# Patient Record
Sex: Male | Born: 1965
Health system: Southern US, Community
[De-identification: ages and names within clinical notes are randomized; demographics above are authoritative.]

## PROBLEM LIST (undated history)

## (undated) DIAGNOSIS — N200 Calculus of kidney: Secondary | ICD-10-CM

## (undated) DIAGNOSIS — E785 Hyperlipidemia, unspecified: Secondary | ICD-10-CM

## (undated) HISTORY — DX: Hyperlipidemia, unspecified: E78.5

## (undated) HISTORY — DX: Calculus of kidney: N20.0

---

## 2011-10-24 ENCOUNTER — Ambulatory Visit (INDEPENDENT_AMBULATORY_CARE_PROVIDER_SITE_OTHER): Payer: BC Managed Care – PPO | Admitting: Family Medicine

## 2011-10-24 DIAGNOSIS — F411 Generalized anxiety disorder: Secondary | ICD-10-CM

## 2011-10-24 DIAGNOSIS — R0602 Shortness of breath: Secondary | ICD-10-CM

## 2011-11-11 ENCOUNTER — Telehealth: Payer: Self-pay

## 2011-11-11 NOTE — Telephone Encounter (Signed)
.  [facility]    PT REQUESTING LAB RESULTS  BEST PHONE  [phone]

## 2011-11-12 NOTE — Telephone Encounter (Signed)
Chart not filed back, and don't see in [facility]. Can you please check on lab results?

## 2013-02-02 ENCOUNTER — Encounter: Payer: Self-pay | Admitting: Family Medicine

## 2013-02-02 ENCOUNTER — Ambulatory Visit (INDEPENDENT_AMBULATORY_CARE_PROVIDER_SITE_OTHER): Payer: BC Managed Care – PPO | Admitting: Family Medicine

## 2013-02-02 VITALS — BP 125/70 | HR 81 | Temp 98.0°F | Resp 18 | Wt 208.0 lb

## 2013-02-02 DIAGNOSIS — Z0000 Encounter for general adult medical examination without abnormal findings: Secondary | ICD-10-CM

## 2013-02-02 DIAGNOSIS — M5136 Other intervertebral disc degeneration, lumbar region: Secondary | ICD-10-CM

## 2013-02-02 DIAGNOSIS — M51379 Other intervertebral disc degeneration, lumbosacral region without mention of lumbar back pain or lower extremity pain: Secondary | ICD-10-CM

## 2013-02-02 DIAGNOSIS — G4720 Circadian rhythm sleep disorder, unspecified type: Secondary | ICD-10-CM | POA: Insufficient documentation

## 2013-02-02 DIAGNOSIS — M5137 Other intervertebral disc degeneration, lumbosacral region: Secondary | ICD-10-CM

## 2013-02-02 DIAGNOSIS — R5383 Other fatigue: Secondary | ICD-10-CM

## 2013-02-02 DIAGNOSIS — R5381 Other malaise: Secondary | ICD-10-CM

## 2013-02-02 DIAGNOSIS — E7800 Pure hypercholesterolemia, unspecified: Secondary | ICD-10-CM

## 2013-02-02 DIAGNOSIS — L609 Nail disorder, unspecified: Secondary | ICD-10-CM

## 2013-02-02 DIAGNOSIS — E785 Hyperlipidemia, unspecified: Secondary | ICD-10-CM | POA: Insufficient documentation

## 2013-02-02 DIAGNOSIS — G479 Sleep disorder, unspecified: Secondary | ICD-10-CM

## 2013-02-02 DIAGNOSIS — L608 Other nail disorders: Secondary | ICD-10-CM

## 2013-02-02 LAB — POCT URINALYSIS DIPSTICK
Bilirubin, UA: NEGATIVE
Glucose, UA: NEGATIVE
Ketones, UA: NEGATIVE
Leukocytes, UA: NEGATIVE
Nitrite, UA: NEGATIVE

## 2013-02-02 NOTE — Patient Instructions (Addendum)
Keeping you healthy  Get these tests  Blood pressure- Have your blood pressure checked once a year by your healthcare provider.  Normal blood pressure is 120/80.  Weight- Have your body mass index (BMI) calculated to screen for obesity.  BMI is a measure of body fat based on height and weight. You can also calculate your own BMI at https://www.west-esparza.com/.  Cholesterol- Have your cholesterol checked regularly starting at age 46, sooner may be necessary if you have diabetes, high blood pressure, if a family member developed heart diseases at an early age or if you smoke.   Chlamydia, HIV, and other sexual transmitted disease- Get screened each year until the age of 70 then within three months of each new sexual partner.  Diabetes- Have your blood sugar checked regularly if you have high blood pressure, high cholesterol, a family history of diabetes or if you are overweight.  Get these vaccines  Flu shot- Every fall.  Tetanus shot- Every 10 years. The CDC recommends a one-time Tdap vaccine to protect against Tetanus and pertussis (whooping cough).  Menactra- Single dose; prevents meningitis.  Take these steps  Don't smoke- If you do smoke, ask your healthcare provider about quitting. For tips on how to quit, go to www.smokefree.gov or call 1-800-QUIT-NOW.  Be physically active- Exercise 5 days a week for at least 30 minutes.  If you are not already physically active start slow and gradually work up to 30 minutes of moderate physical activity.  Examples of moderate activity include walking briskly, mowing the yard, dancing, swimming bicycling, etc.  Eat a healthy diet- Eat a variety of healthy foods such as fruits, vegetables, low fat milk, low fat cheese, yogurt, lean meats, poultry, fish, beans, tofu, etc.  For more information on healthy eating, go to www.thenutritionsource.org  Drink alcohol in moderation- Limit alcohol intake two drinks or less a day.  Never drink and  drive.  Dentist- Brush and floss teeth twice daily; visit your dentis twice a year.  Depression-Your emotional health is as important as your physical health.  If you're feeling down, losing interest in things you normally enjoy please talk with your healthcare provider.  Gun Safety- If you keep a gun in your home, keep it unloaded and with the safety lock on.  Bullets should be stored separately.  Helmet use- Always wear a helmet when riding a motorcycle, bicycle, rollerblading or skateboarding.  Safe sex- If you may be exposed to a sexually transmitted infection, use a condom  Seat belts- Seat bels can save your life; always wear one.  Smoke/Carbon Monoxide detectors- These detectors need to be installed on the appropriate level of your home.  Replace batteries at least once a year.  Skin Cancer- When out in the sun, cover up and use sunscreen SPF 15 or higher.  Violence- If anyone is threatening or hurting you, please tell your healthcare provider.  A good multivitamin is important if you do not have balanced nutrition with regular meals and healthy snacks. One-A-Day for Men or Ash[employer] Made or Centrum for Men are good brands. I have ordered a referral to a chiropractor to evaluate and treat your lumbar disc disease as well as a podiatrist for the nail problem.  You will be working on improving sleep pattern by establishing a rotation for call with your work place.   Insomnia Insomnia is frequent trouble falling and/or staying asleep. Insomnia can be a long term problem or a short term problem. Both are common. Insomnia can be  a short term problem when the wakefulness is related to a certain stress or worry. Long term insomnia is often related to ongoing stress during waking hours and/or poor sleeping habits. Overtime, sleep deprivation itself can make the problem worse. Every little thing feels more severe because you are overtired and your ability to cope is decreased. CAUSES    Stress, anxiety, and depression.  Poor sleeping habits.  Distractions such as TV in the bedroom.  Naps close to bedtime.  Engaging in emotionally charged conversations before bed.  Technical reading before sleep.  Alcohol and other sedatives. They may make the problem worse. They can hurt normal sleep patterns and normal dream activity.  Stimulants such as caffeine for several hours prior to bedtime.  Pain syndromes and shortness of breath can cause insomnia.  Exercise late at night.  Changing time zones may cause sleeping problems (jet lag). It is sometimes helpful to have someone observe your sleeping patterns. They should look for periods of not breathing during the night (sleep apnea). They should also look to see how long those periods last. If you live alone or observers are uncertain, you can also be observed at a sleep clinic where your sleep patterns will be professionally monitored. Sleep apnea requires a checkup and treatment. Give your caregivers your medical history. Give your caregivers observations your family has made about your sleep.  SYMPTOMS   Not feeling rested in the morning.  Anxiety and restlessness at bedtime.  Difficulty falling and staying asleep. TREATMENT   Your caregiver may prescribe treatment for an underlying medical disorders. Your caregiver can give advice or help if you are using alcohol or other drugs for self-medication. Treatment of underlying problems will usually eliminate insomnia problems.  Medications can be prescribed for short time use. They are generally not recommended for lengthy use.  Over-the-counter sleep medicines are not recommended for lengthy use. They can be habit forming.  You can promote easier sleeping by making lifestyle changes such as:  Using relaxation techniques that help with breathing and reduce muscle tension.  Exercising earlier in the day.  Changing your diet and the time of your last meal. No night  time snacks.  Establish a regular time to go to bed.  Counseling can help with stressful problems and worry.  Soothing music and white noise may be helpful if there are background noises you cannot remove.  Stop tedious detailed work at least one hour before bedtime. HOME CARE INSTRUCTIONS   Keep a diary. Inform your caregiver about your progress. This includes any medication side effects. See your caregiver regularly. Take note of:  Times when you are asleep.  Times when you are awake during the night.  The quality of your sleep.  How you feel the next day. This information will help your caregiver care for you.  Get out of bed if you are still awake after 15 minutes. Read or do some quiet activity. Keep the lights down. Wait until you feel sleepy and go back to bed.  Keep regular sleeping and waking hours. Avoid naps.  Exercise regularly.  Avoid distractions at bedtime. Distractions include watching television or engaging in any intense or detailed activity like attempting to balance the household checkbook.  Develop a bedtime ritual. Keep a familiar routine of bathing, brushing your teeth, climbing into bed at the same time each night, listening to soothing music. Routines increase the success of falling to sleep faster.  Use relaxation techniques. This can be using breathing and  muscle tension release routines. It can also include visualizing peaceful scenes. You can also help control troubling or intruding thoughts by keeping your mind occupied with boring or repetitive thoughts like the old concept of counting sheep. You can make it more creative like imagining planting one beautiful flower after another in your backyard garden.  During your day, work to eliminate stress. When this is not possible use some of the previous suggestions to help reduce the anxiety that accompanies stressful situations. MAKE SURE YOU:   Understand these instructions.  Will watch your  condition.  Will get help right away if you are not doing well or get worse. Document Released: 09/19/2000 Document Revised: 12/15/2011 Document Reviewed: 10/20/2007 [facility] Patient Information 2013 [place], [place].

## 2013-02-02 NOTE — Progress Notes (Signed)
Subjective:    Patient ID: Steven Raymond, male    DOB: Nov 17, 1965, 47 y.o.   MRN: [id number]  HPI This 47 y.o. Cauc male is here for CPE; hehas no specific chronic medical problems but is  concerned about chronic fatigue. Nutrition is fair; pt does try to avoid "junk food" but diet is not  balanced. "Low T" is a concern. His work day is long and requires physical fitness. 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 These workouts often leave him  feeling battered and bruised and have aggravated his neck discomfort, joint and back pain.   He has a seep disturbance; this is related to his on-call status w/ his job. He gets calls in the   middle of the night that requires him to get up and leave his home to handles different issues.  Melatonin sedated him for a few hours then he was wide awake ("kinda wired"). A new mattress  has been purchased within the last year.   PMHx, Soc Hx and Fam Hx reviewed.     Review of Systems  Constitutional: Positive for fatigue. Negative for fever, diaphoresis, activity change, appetite change and unexpected weight change.  HENT: Positive for neck stiffness. Negative for neck pain.   Eyes:       Wears corrective lenses.  Respiratory: Negative.   Cardiovascular: Negative.   Gastrointestinal: Negative.   Endocrine: Negative.   Genitourinary: Negative.   Musculoskeletal: Positive for back pain and arthralgias. Negative for myalgias, joint swelling and gait problem.  Skin: Negative.   Allergic/Immunologic: Negative.   Neurological: Negative.   Hematological: Negative.   Psychiatric/Behavioral: Positive for sleep disturbance.       Objective:   Physical Exam  Nursing note and vitals reviewed. Constitutional: He is oriented to person, place, and time. Vital signs are normal. He appears well-developed and well-nourished. No distress.  HENT:  Head: Normocephalic and atraumatic.  Right Ear: Hearing, tympanic membrane,  external ear and ear canal normal.  Left Ear: Hearing, tympanic membrane, external ear and ear canal normal.  Nose: Nose normal. No nasal deformity or septal deviation.  Mouth/Throat: Uvula is midline, oropharynx is clear and moist and mucous membranes are normal. No oral lesions. Normal dentition. No dental caries.  Eyes: Conjunctivae, EOM and lids are normal. Pupils are equal, round, and reactive to light. No scleral icterus.  Fundoscopic exam:      The right eye shows no papilledema. The right eye shows red reflex.       The left eye shows no papilledema. The left eye shows red reflex.  Neck: Normal range of motion. Neck supple. No thyromegaly present.  Cardiovascular: Normal rate, regular rhythm, normal heart sounds and intact distal pulses.  Exam reveals no gallop and no friction rub.   No murmur heard. Pulmonary/Chest: Effort normal and breath sounds normal. No respiratory distress. He has no wheezes. He exhibits no tenderness.  Abdominal: Soft. Normal appearance and bowel sounds are normal. He exhibits no distension, no pulsatile midline mass and no mass. There is no hepatosplenomegaly. There is no tenderness. There is no guarding and no CVA tenderness. No hernia.  Genitourinary:  Deferred.  Musculoskeletal: Normal range of motion. He exhibits no edema and no tenderness.       Lumbar back: He exhibits tenderness and bony tenderness. He exhibits no swelling, no deformity, no pain and no spasm.  Lymphadenopathy:    He has no cervical adenopathy.  Neurological: He is alert and oriented to  person, place, and time. He has normal reflexes. No cranial nerve deficit. He exhibits normal muscle tone. Coordination normal.  Skin: Skin is warm and dry. No rash noted. No erythema. No pallor.  Feet- great toenail is discolored and deformed.  Psychiatric: He has a normal mood and affect. Judgment and thought content normal. His speech is tangential. His speech is not rapid and/or pressured and not  delayed. He is hyperactive. He is not agitated, not aggressive and not withdrawn. Cognition and memory are normal. He is communicative. He is attentive.    Results for orders placed in visit on 02/02/13  POCT URINALYSIS DIPSTICK      Result Value Range   Color, UA yellow     Clarity, UA slightly cloudy     Glucose, UA neg     Bilirubin, UA neg     Ketones, UA neg     Spec Grav, UA 1.020     Blood, UA neg     pH, UA 8.5     Protein, UA neg     Urobilinogen, UA 0.2     Nitrite, UA neg     Leukocytes, UA Negative          Assessment & Plan:  Routine general medical examination at a health care facility - Plan: POCT urinalysis dipstick  Other malaise and fatigue - Plan: Comprehensive metabolic panel, Vitamin D, 25-hydroxy, CBC with Differential, Testosterone, free, total  Sleep pattern disturbance- pt advised to be pro-active re: limiting work-related on-call schedule and night calls. Institute regular bedtime routine.  Nail deformity - Plan: Ambulatory referral to Podiatry  DDD (degenerative disc disease), lumbar - advised that pt discontinued participation in MMA fitness. Discuss some other aerobic exercise or one-on-one training with trainer.  Plan: Ambulatory referral to Chiropractic  Pure hypercholesterolemia - elevated values (TChol, LDL and TGs last year)      Plan: Lipid panel

## 2013-02-03 ENCOUNTER — Telehealth: Payer: Self-pay | Admitting: Radiology

## 2013-02-03 DIAGNOSIS — R5381 Other malaise: Secondary | ICD-10-CM

## 2013-02-03 LAB — CBC WITH DIFFERENTIAL/PLATELET

## 2013-02-03 LAB — COMPREHENSIVE METABOLIC PANEL
ALT: 18 U/L (ref 0–53)
Albumin: 4.5 g/dL (ref 3.5–5.2)
CO2: 25 mEq/L (ref 19–32)
Glucose, Bld: 93 mg/dL (ref 70–99)
Potassium: 4.6 mEq/L (ref 3.5–5.3)
Sodium: 137 mEq/L (ref 135–145)
Total Bilirubin: 0.4 mg/dL (ref 0.3–1.2)
Total Protein: 7.3 g/dL (ref 6.0–8.3)

## 2013-02-03 LAB — LIPID PANEL
Cholesterol: 197 mg/dL (ref 0–200)
LDL Cholesterol: 126 mg/dL — ABNORMAL HIGH (ref 0–99)
Triglycerides: 97 mg/dL (ref <150)
VLDL: 19 mg/dL (ref 0–40)

## 2013-02-03 LAB — TESTOSTERONE, FREE, TOTAL, SHBG
Sex Hormone Binding: 46 nmol/L (ref 13–71)
Testosterone, Free: 70.9 pg/mL (ref 47.0–244.0)
Testosterone: 428 ng/dL (ref 300–890)

## 2013-02-03 NOTE — Telephone Encounter (Signed)
Phone call from [facility], no purple top received CBC can not be done. Cancelled at lab to you [facility]

## 2013-02-03 NOTE — Telephone Encounter (Signed)
Received call from [facility], they did not receive the lavender top to perform CBC. Would you like for [place] to call patient and have them come back in at no charge or redraw at next office visit? Discussed with Dr Audria Nine and she stated if patient is fine with coming in to have re-draw at no charge that would be okay. I called patient and informed of the issue-he is agreeable to come in at no charge and have redraw of blood work. I apologized again for the inconvenience. I will place the order for CBC with Diff.

## 2013-02-03 NOTE — Telephone Encounter (Signed)
Received call from [facility], they did not receive the lavender top to perform CBC. Would you like for [place] to call patient and have them come back in at no charge or redraw at next office visit?

## 2013-02-05 ENCOUNTER — Encounter: Payer: Self-pay | Admitting: Family Medicine

## 2013-08-11 ENCOUNTER — Other Ambulatory Visit: Payer: Self-pay

## 2013-09-23 DIAGNOSIS — B351 Tinea unguium: Secondary | ICD-10-CM

## 2013-11-07 ENCOUNTER — Telehealth: Payer: Self-pay | Admitting: Radiology

## 2013-11-07 ENCOUNTER — Ambulatory Visit: Payer: BC Managed Care – PPO

## 2013-11-07 ENCOUNTER — Ambulatory Visit (INDEPENDENT_AMBULATORY_CARE_PROVIDER_SITE_OTHER): Payer: BC Managed Care – PPO | Admitting: Emergency Medicine

## 2013-11-07 VITALS — BP 118/78 | HR 74 | Temp 97.6°F | Resp 16 | Ht 74.0 in | Wt 201.6 lb

## 2013-11-07 DIAGNOSIS — R0602 Shortness of breath: Secondary | ICD-10-CM

## 2013-11-07 DIAGNOSIS — M542 Cervicalgia: Secondary | ICD-10-CM

## 2013-11-07 DIAGNOSIS — IMO0001 Reserved for inherently not codable concepts without codable children: Secondary | ICD-10-CM

## 2013-11-07 DIAGNOSIS — R638 Other symptoms and signs concerning food and fluid intake: Secondary | ICD-10-CM

## 2013-11-07 DIAGNOSIS — R9389 Abnormal findings on diagnostic imaging of other specified body structures: Secondary | ICD-10-CM

## 2013-11-07 LAB — COMPREHENSIVE METABOLIC PANEL
ALK PHOS: 65 U/L (ref 39–117)
ALT: 18 U/L (ref 0–53)
AST: 17 U/L (ref 0–37)
Albumin: 4.5 g/dL (ref 3.5–5.2)
BILIRUBIN TOTAL: 0.6 mg/dL (ref 0.2–1.2)
BUN: 20 mg/dL (ref 6–23)
CALCIUM: 9.4 mg/dL (ref 8.4–10.5)
CHLORIDE: 102 meq/L (ref 96–112)
CO2: 29 mEq/L (ref 19–32)
CREATININE: 0.99 mg/dL (ref 0.50–1.35)
Glucose, Bld: 97 mg/dL (ref 70–99)
Potassium: 4.4 mEq/L (ref 3.5–5.3)
Sodium: 137 mEq/L (ref 135–145)
Total Protein: 7.1 g/dL (ref 6.0–8.3)

## 2013-11-07 LAB — POCT CBC
GRANULOCYTE PERCENT: 69.5 % (ref 37–80)
HCT, POC: 51.7 % (ref 43.5–53.7)
Hemoglobin: 16.4 g/dL (ref 14.1–18.1)
Lymph, poc: 2 (ref 0.6–3.4)
MCH, POC: 30.9 pg (ref 27–31.2)
MCHC: 31.7 g/dL — AB (ref 31.8–35.4)
MCV: 97.3 fL — AB (ref 80–97)
MID (cbc): 0.6 (ref 0–0.9)
MPV: 9.3 fL (ref 0–99.8)
PLATELET COUNT, POC: 257 10*3/uL (ref 142–424)
POC GRANULOCYTE: 5.9 (ref 2–6.9)
POC LYMPH PERCENT: 24 %L (ref 10–50)
POC MID %: 6.5 %M (ref 0–12)
RBC: 5.31 M/uL (ref 4.69–6.13)
RDW, POC: 13.8 %
WBC: 8.5 10*3/uL (ref 4.6–10.2)

## 2013-11-07 LAB — POCT URINALYSIS DIPSTICK
Bilirubin, UA: NEGATIVE
Blood, UA: NEGATIVE
Glucose, UA: NEGATIVE
Ketones, UA: NEGATIVE
LEUKOCYTES UA: NEGATIVE
NITRITE UA: NEGATIVE
PH UA: 8.5
Protein, UA: NEGATIVE
Spec Grav, UA: 1.015
UROBILINOGEN UA: 0.2

## 2013-11-07 LAB — GLUCOSE, POCT (MANUAL RESULT ENTRY): POC GLUCOSE: 96 mg/dL (ref 70–99)

## 2013-11-07 LAB — D-DIMER, QUANTITATIVE: D-Dimer, Quant: 0.22 ug/mL-FEU (ref 0.00–0.48)

## 2013-11-07 MED ORDER — LORAZEPAM 1 MG PO TABS: ORAL_TABLET | ORAL | Status: DC

## 2013-11-07 NOTE — Progress Notes (Signed)
   Subjective:    Patient ID: Steven Raymond, male    DOB: 1966-07-15, 48 y.o.   MRN: [id number]  HPI Pt here c/o of sob of breath feels like there is fluid in his lungs. Worse last week and today. Feels like pneumonia. Denies cough, sore throat, chest congestion or asthma. Also tightness in the chest. Patient states he has a very stressful job. He has a very erratic schedule. He is up late into the night. He does not work any particular shifts    Review of Systems     Objective:   Physical Exam patient is alert and cooperative but does seem very anxious. Pupils are equal reactive to light. His neck is supple. Chest was clear to auscultation and percussion. Heart was regular rate without murmurs or gallops. Abdomen was soft liver and spleen not enlarged and no areas of tenderness. Extremities are without edema there is no calf tenderness   Ekg normal sinus rhythm Results for orders placed in visit on 11/07/13  POCT CBC      Result Value Range   WBC 8.5  4.6 - 10.2 K/uL   Lymph, poc 2.0  0.6 - 3.4   POC LYMPH PERCENT 24.0  10 - 50 %L   MID (cbc) 0.6  0 - 0.9   POC MID % 6.5  0 - 12 %M   POC Granulocyte 5.9  2 - 6.9   Granulocyte percent 69.5  37 - 80 %G   RBC 5.31  4.69 - 6.13 M/uL   Hemoglobin 16.4  14.1 - 18.1 g/dL   HCT, POC 51.7  43.5 - 53.7 %   MCV 97.3 (*) 80 - 97 fL   MCH, POC 30.9  27 - 31.2 pg   MCHC 31.7 (*) 31.8 - 35.4 g/dL   RDW, POC 13.8     Platelet Count, POC 257  142 - 424 K/uL   MPV 9.3  0 - 99.8 fL  GLUCOSE, POCT (MANUAL RESULT ENTRY)      Result Value Range   POC Glucose 96  70 - 99 mg/dl  POCT URINALYSIS DIPSTICK      Result Value Range   Color, UA yellow     Clarity, UA cloudy     Glucose, UA neg     Bilirubin, UA neg     Ketones, UA neg     Spec Grav, UA 1.015     Blood, UA neg     pH, UA 8.5     Protein, UA neg     Urobilinogen, UA 0.2     Nitrite, UA neg     Leukocytes, UA Negative        [facility] reading (PRIMARY) by  Dr. Everlene Farrier increased markings  both bases no consolidative pneumonia. C-spine films showed degenerative changes C6-C7 Zung anxiety score was 50 which puts him in the minimal to moderate anxiety level.  Assessment & Plan:  Patient appears to have anxiety. His urine drug screen was negative for any type of stimulant. Will try Ativan and see if this helps. A d-dimer was checked because of his travel history. Referral made to pulmonary because of the abnormal chest x-ray reading.

## 2013-11-07 NOTE — Telephone Encounter (Signed)
received STAT lab result- d dimer: 0.22 Informed Dr Danielle Rankin Normal result  Called patient and informed patient normal results; continue with medication plan. Patient understands

## 2013-11-08 LAB — T4, FREE: Free T4: 1.18 ng/dL (ref 0.80–1.80)

## 2013-11-08 LAB — TSH: TSH: 0.783 u[IU]/mL (ref 0.350–4.500)

## 2013-11-11 ENCOUNTER — Institutional Professional Consult (permissible substitution): Payer: Self-pay | Admitting: Pulmonary Disease

## 2013-12-06 ENCOUNTER — Institutional Professional Consult (permissible substitution): Payer: Self-pay | Admitting: Pulmonary Disease

## 2014-01-20 ENCOUNTER — Ambulatory Visit (INDEPENDENT_AMBULATORY_CARE_PROVIDER_SITE_OTHER): Payer: BC Managed Care – PPO | Admitting: Family Medicine

## 2014-01-20 VITALS — BP 122/74 | HR 70 | Temp 98.2°F | Resp 16 | Ht 74.0 in | Wt 207.7 lb

## 2014-01-20 DIAGNOSIS — R109 Unspecified abdominal pain: Secondary | ICD-10-CM

## 2014-01-20 DIAGNOSIS — F411 Generalized anxiety disorder: Secondary | ICD-10-CM

## 2014-01-20 LAB — POCT URINALYSIS DIPSTICK
Bilirubin, UA: NEGATIVE
Glucose, UA: NEGATIVE
KETONES UA: NEGATIVE
Leukocytes, UA: NEGATIVE
Nitrite, UA: NEGATIVE
Protein, UA: NEGATIVE
RBC UA: NEGATIVE
Spec Grav, UA: 1.025
UROBILINOGEN UA: 0.2
pH, UA: 7

## 2014-01-20 LAB — COMPREHENSIVE METABOLIC PANEL
ALK PHOS: 62 U/L (ref 39–117)
ALT: 24 U/L (ref 0–53)
AST: 19 U/L (ref 0–37)
Albumin: 4.4 g/dL (ref 3.5–5.2)
BILIRUBIN TOTAL: 0.6 mg/dL (ref 0.2–1.2)
BUN: 17 mg/dL (ref 6–23)
CO2: 25 mEq/L (ref 19–32)
CREATININE: 1.02 mg/dL (ref 0.50–1.35)
Calcium: 9.3 mg/dL (ref 8.4–10.5)
Chloride: 100 mEq/L (ref 96–112)
Glucose, Bld: 97 mg/dL (ref 70–99)
Potassium: 4.2 mEq/L (ref 3.5–5.3)
Sodium: 133 mEq/L — ABNORMAL LOW (ref 135–145)
Total Protein: 7.1 g/dL (ref 6.0–8.3)

## 2014-01-20 LAB — POCT CBC
Granulocyte percent: 51.9 %G (ref 37–80)
HCT, POC: 49 % (ref 43.5–53.7)
HEMOGLOBIN: 16 g/dL (ref 14.1–18.1)
LYMPH, POC: 2.3 (ref 0.6–3.4)
MCH, POC: 31.1 pg (ref 27–31.2)
MCHC: 32.7 g/dL (ref 31.8–35.4)
MCV: 95.3 fL (ref 80–97)
MID (CBC): 0.5 (ref 0–0.9)
MPV: 9.2 fL (ref 0–99.8)
POC GRANULOCYTE: 3 (ref 2–6.9)
POC LYMPH PERCENT: 39.6 %L (ref 10–50)
POC MID %: 8.5 % (ref 0–12)
Platelet Count, POC: 276 10*3/uL (ref 142–424)
RBC: 5.14 M/uL (ref 4.69–6.13)
RDW, POC: 12.9 %
WBC: 5.8 10*3/uL (ref 4.6–10.2)

## 2014-01-20 LAB — POCT UA - MICROSCOPIC ONLY
Amorphous: POSITIVE
Bacteria, U Microscopic: NEGATIVE
CASTS, UR, LPF, POC: NEGATIVE
Crystals, Ur, HPF, POC: NEGATIVE
EPITHELIAL CELLS, URINE PER MICROSCOPY: NEGATIVE
MUCUS UA: NEGATIVE
RBC, urine, microscopic: NEGATIVE
WBC, UR, HPF, POC: NEGATIVE
YEAST UA: NEGATIVE

## 2014-01-20 MED ORDER — LORAZEPAM 1 MG PO TABS: ORAL_TABLET | ORAL | Status: DC

## 2014-01-20 MED ORDER — METHOCARBAMOL 500 MG PO TABS: 500.0000 mg | ORAL_TABLET | Freq: Four times a day (QID) | ORAL | Status: DC

## 2014-01-20 MED ORDER — TAMSULOSIN HCL 0.4 MG PO CAPS: 0.4000 mg | ORAL_CAPSULE | Freq: Every day | ORAL | Status: DC

## 2014-01-20 MED ORDER — HYDROCODONE-ACETAMINOPHEN 5-325 MG PO TABS: 1.0000 | ORAL_TABLET | Freq: Three times a day (TID) | ORAL | Status: DC | PRN

## 2014-01-20 NOTE — Patient Instructions (Signed)
I will get in touch with you with the rest of your labs. Continue to drink plenty of water and strain your urine.  Take 1 flomax pill a day for the next week or two.   Try the robaxin as needed for back pain- this is a muscle relaxer and could make you feel sleepy.  If you have severe pain please seek care, but you can also use the vicodin in this case.    Remember your ativan, robaxin and vicodin can all make you feel drowsy; avoid using these medications together.    If you have severe back or abdominal pain, fever, vomiting, or severe testicular pain seek care right away.

## 2014-01-20 NOTE — Progress Notes (Signed)
[facility] [address] [place] [address] [phone]  Date:  01/20/2014   Name:  Steven Raymond   DOB:  1966/06/18   MRN:  [id number]  PCP:  No primary provider on file.    Chief Complaint: Flank Pain and Testicle Pain   History of Present Illness:  Steven Raymond is a 48 y.o. very pleasant male patient who presents with the following:  History of kidney stones- he has had a couple of stones in the past and has passed them on his own.  Never needed any sort of procedure.  About 3 weeks ago he awoke with some pain in his right flank.  It seemed "pretty sharp."  He was not too surprised as he is quite active and has some aches and pains sometimes.  He has noted the discomfort off and on in both sides of his back over the last few weeks.  He was not sure if it was muscular or could be a kidney stone.   Last night/ this morning he noted a mild discomfort and pressure in both his testicles.  This can wax and wane, and can get pretty uncomfortable sometimes.   No hematuria that he has noticed at home.  No penile discharge He has not noted any dysuria. However its kind of hard to saw now that his testicles are tender as well.    He is generally healthy  He does have a history of back problems, has had numbness in his right anterior thigh for 20 years- this is not new.  He does not have any groin or genital numbness, no bowel or bladder dysfunction.    He was given some ativan a few month ago which he takes on occasion for anxiety.  This is helpful for him.  He has 1 or 2 left but would like a RF is possible   Patient Active Problem List   Diagnosis Date Noted  . Sleep pattern disturbance 02/02/2013  . DDD (degenerative disc disease), lumbar 02/02/2013  . Other and unspecified hyperlipidemia 02/02/2013    Past Medical History  Diagnosis Date  . Kidney stones     History reviewed. No pertinent past surgical history.  History  Substance Use Topics  .  Smoking status: Current Some Day Smoker  . Smokeless tobacco: Not on file  . Alcohol Use: Yes    History reviewed. No pertinent family history.  No Known Allergies  Medication list has been reviewed and updated.  Current Outpatient Prescriptions on File Prior to Visit  Medication Sig Dispense Refill  . LORazepam (ATIVAN) 1 MG tablet Take one half to one tablet twice a day as needed for anxiety  30 tablet  0   No current facility-administered medications on file prior to visit.    Review of Systems:  As per HPI- otherwise negative.   Physical Examination: Filed Vitals:   01/20/14 1259  BP: 122/74  Pulse: 70  Temp: 98.2 F (36.8 C)  Resp: 16   Filed Vitals:   01/20/14 1259  Height: 6\' 2"  (1.88 m)  Weight: 207 lb 11.2 oz (94.212 kg)   Body mass index is 26.66 kg/(m^2). Ideal Body Weight: Weight in (lb) to have BMI = 25: 194.3  GEN: WDWN, NAD, Non-toxic, A & O x 3, appears well HEENT: Atraumatic, Normocephalic. Neck supple. No masses, No LAD.  Bilateral TM wnl, oropharynx normal.  PEERL,EOMI.   Ears and Nose: No external deformity. CV: RRR, No M/G/R. No JVD.  No thrill. No extra heart sounds. PULM: CTA B, no wheezes, crackles, rhonchi. No retractions. No resp. distress. No accessory muscle use. ABD: S, NT, ND, +BS. No rebound. No HSM. EXTR: No c/c/e NEURO Normal gait.  PSYCH: Normally interactive.  Anxious and talkative.  GU: pt reluctant to allow me to examine his genitals- "I'm fine."  Noted to have minimal discomfort in testicles, more so on the right.  No point tenderness, more of a general discomfort.  This is not severe, and he does not have any suggestion of torsion.  No hernia.  No heat, swelling or redness.   Bilateral LE with normal strength and DTR.  He is slightly tender over the muscles in his lumbar spine bilaterally   Results for orders placed in visit on 01/20/14  POCT UA - MICROSCOPIC ONLY      Result Value Ref Range   WBC, Ur, HPF, POC neg     RBC,  urine, microscopic neg     Bacteria, U Microscopic neg     Mucus, UA neg     Epithelial cells, urine per micros neg     Crystals, Ur, HPF, POC neg     Casts, Ur, LPF, POC neg     Yeast, UA neg     Amorphous positive    POCT URINALYSIS DIPSTICK      Result Value Ref Range   Color, UA yellow     Clarity, UA clear     Glucose, UA neg     Bilirubin, UA neg     Ketones, UA neg     Spec Grav, UA 1.025     Blood, UA neg     pH, UA 7.0     Protein, UA neg     Urobilinogen, UA 0.2     Nitrite, UA neg     Leukocytes, UA Negative    POCT CBC      Result Value Ref Range   WBC 5.8  4.6 - 10.2 K/uL   Lymph, poc 2.3  0.6 - 3.4   POC LYMPH PERCENT 39.6  10 - 50 %L   MID (cbc) 0.5  0 - 0.9   POC MID % 8.5  0 - 12 %M   POC Granulocyte 3.0  2 - 6.9   Granulocyte percent 51.9  37 - 80 %G   RBC 5.14  4.69 - 6.13 M/uL   Hemoglobin 16.0  14.1 - 18.1 g/dL   HCT, POC 49.0  43.5 - 53.7 %   MCV 95.3  80 - 97 fL   MCH, POC 31.1  27 - 31.2 pg   MCHC 32.7  31.8 - 35.4 g/dL   RDW, POC 12.9     Platelet Count, POC 276  142 - 424 K/uL   MPV 9.2  0 - 99.8 fL    Assessment and Plan: Flank pain - Plan: POCT UA - Microscopic Only, POCT urinalysis dipstick, POCT CBC, Comprehensive metabolic panel, Urine culture, tamsulosin (FLOMAX) 0.4 MG CAPS capsule, methocarbamol (ROBAXIN) 500 MG tablet, HYDROcodone-acetaminophen (NORCO/VICODIN) 5-325 MG per tablet  Generalized anxiety disorder - Plan: LORazepam (ATIVAN) 1 MG tablet  Refilled his prn lorazepam.   Explained that his urine is negative- there is no evidence of a kidney stone.  However, it is still possible he could have a stone.  Offered a CT scan, and/ or testicular ultrasound.  However, do not believe he could have a torsion as his sx are mild. He declined CT and ultrasound. Prefers  to observe for the next day or two which is reasonable.   Will plan further follow- up pending labs. See patient instructions for more details.   Cautioned regarding  sedation with his medications  Signed Lamar Blinks, MD

## 2014-01-22 LAB — URINE CULTURE
Colony Count: NO GROWTH
Organism ID, Bacteria: NO GROWTH

## 2014-01-22 NOTE — Addendum Note (Signed)
Addended by: Lamar Blinks C on: 01/22/2014 03:19 PM   Modules accepted: Orders

## 2014-02-24 DIAGNOSIS — B351 Tinea unguium: Secondary | ICD-10-CM

## 2014-07-20 ENCOUNTER — Other Ambulatory Visit: Payer: Self-pay | Admitting: Family Medicine

## 2014-07-20 DIAGNOSIS — F418 Other specified anxiety disorders: Secondary | ICD-10-CM

## 2014-08-14 ENCOUNTER — Encounter: Payer: Self-pay | Admitting: Family Medicine

## 2014-08-14 ENCOUNTER — Ambulatory Visit (INDEPENDENT_AMBULATORY_CARE_PROVIDER_SITE_OTHER): Payer: BC Managed Care – PPO | Admitting: Family Medicine

## 2014-08-14 VITALS — BP 110/70 | HR 65 | Temp 97.9°F | Resp 16 | Ht 75.5 in | Wt 215.0 lb

## 2014-08-14 DIAGNOSIS — R10A1 Flank pain, right side: Secondary | ICD-10-CM

## 2014-08-14 DIAGNOSIS — R109 Unspecified abdominal pain: Secondary | ICD-10-CM

## 2014-08-14 DIAGNOSIS — F411 Generalized anxiety disorder: Secondary | ICD-10-CM

## 2014-08-14 LAB — CBC
HCT: 43.7 % (ref 39.0–52.0)
HEMOGLOBIN: 15.2 g/dL (ref 13.0–17.0)
MCH: 31.3 pg (ref 26.0–34.0)
MCHC: 34.8 g/dL (ref 30.0–36.0)
MCV: 89.9 fL (ref 78.0–100.0)
Platelets: 258 10*3/uL (ref 150–400)
RBC: 4.86 MIL/uL (ref 4.22–5.81)
RDW: 13.8 % (ref 11.5–15.5)
WBC: 6.3 10*3/uL (ref 4.0–10.5)

## 2014-08-14 NOTE — Progress Notes (Signed)
[facility] [address] [place] [address] [phone]  Date:  08/14/2014   Name:  Steven Raymond   DOB:  August 26, 1966   MRN:  [id number]  PCP:  No primary care provider on file.    Chief Complaint: Flank Pain   History of Present Illness:  Steven Raymond is a 48 y.o. very pleasant male patient who presents with the following:  He was here in April with complaint of right flank pain.  At that time his urine looked good and he declined any further evaluation with CT or ultrasound.  I did not see any evidence of a stone at that time, but he felt that he did pass "a tiny stone" after this visit.  He has a history of occasional small kidney stone.    Today he states he has noted pain in the right flank for the last couple of months. It is worse if he puts pressure on this side or if he leans over to the right.  He may notice this more when he is driving.  He feels that he tends to lean to the right when he is driving.  No worsening after eating.   No nausea or vomiting.  He states he has a life- long history of irregular bowels.  There is no change here.  Sx are not changed by stool or lack of stool.    Wt Readings from Last 3 Encounters:  08/14/14 215 lb (97.523 kg)  01/20/14 207 lb 11.2 oz (94.212 kg)  11/07/13 201 lb 9.6 oz (91.445 kg)   He does have a history of anxiety and uses ativan as needed. He does feel that this helps him. He also tries to exercise at the [facility].  "I live in a constant state of anxiety."  Does not feel that he is depressed He does not take ativan daily.- uses as needed  Patient Active Problem List   Diagnosis Date Noted  . Sleep pattern disturbance 02/02/2013  . DDD (degenerative disc disease), lumbar 02/02/2013  . Other and unspecified hyperlipidemia 02/02/2013    Past Medical History  Diagnosis Date  . Kidney stones     No past surgical history on file.  History  Substance Use Topics  . Smoking status: Current Some Day Smoker   . Smokeless tobacco: Not on file  . Alcohol Use: Yes    No family history on file.  No Known Allergies  Medication list has been reviewed and updated.  Current Outpatient Prescriptions on File Prior to Visit  Medication Sig Dispense Refill  . LORazepam (ATIVAN) 1 MG tablet TAKE 1/2-1 TABLET BY MOUTH TWICE A DAY AS NEEDED FOR ANXIETY 30 tablet 0  . HYDROcodone-acetaminophen (NORCO/VICODIN) 5-325 MG per tablet Take 1 tablet by mouth every 8 (eight) hours as needed. 10 tablet 0  . methocarbamol (ROBAXIN) 500 MG tablet Take 1 tablet (500 mg total) by mouth 4 (four) times daily. Use as needed for muscle pain 30 tablet 0  . tamsulosin (FLOMAX) 0.4 MG CAPS capsule Take 1 capsule (0.4 mg total) by mouth daily. 30 capsule 3   No current facility-administered medications on file prior to visit.    Review of Systems:  As per HPI- otherwise negative.   Physical Examination: Filed Vitals:   08/14/14 0936  BP: 100/70  Pulse: 65  Temp: 97.9 F (36.6 C)  Resp: 16   Filed Vitals:   08/14/14 0936  Height: 6' 3.5" (1.918 m)  Weight: 215 lb (  97.523 kg)   Body mass index is 26.51 kg/(m^2). Ideal Body Weight: Weight in (lb) to have BMI = 25: 202.3  GEN: WDWN, NAD, Non-toxic, A & O x 3, anxious and talkative HEENT: Atraumatic, Normocephalic. Neck supple. No masses, No LAD.  Bilateral TM wnl, oropharynx normal.  PEERL,EOMI.   Ears and Nose: No external deformity. CV: RRR, No M/G/R. No JVD. No thrill. No extra heart sounds. PULM: CTA B, no wheezes, crackles, rhonchi. No retractions. No resp. distress. No accessory muscle use. ABD: S, ND, +BS. No rebound. No HSM.  He has mild tenderness over the right oblique muscles.  Otherwise no abdominal tenderness EXTR: No c/c/e NEURO Normal gait.  PSYCH: Normally interactive. Conversant.    Assessment and Plan: Right flank pain - Plan: Comprehensive metabolic panel, CBC, US Abdomen Complete  GAD (generalized anxiety disorder)  Here today with  right side pain which seems most consistent with a muscular etiology. However as this has been persistent will go ahead and set up an ultrasound and check labs.   Suggested that we try counseling or an SSRI for his anxiety, but he declines both of these.  He plans to continue exercise and prn ativan   Signed Lamar Blinks, MD

## 2014-08-14 NOTE — Progress Notes (Deleted)
   Subjective:    Patient ID: Steven Raymond, male    DOB: [birth year], 48 y.o.   MRN: [id number]  HPI   Diet ; Exercise   Health Maintenance: Flu vaccine ; TDAP ; Eye exam  Review of Systems     Objective:   Physical Exam        Assessment & Plan:

## 2014-08-14 NOTE — Patient Instructions (Signed)
I will be in touch with your labs and will set up an ultrasound for you to evaluate your abdominal organs. Let us know if you need anything else in the meantime  Let me know if you want to try anything else to manage your anxiety; we might consider an SSRI or counseling

## 2014-08-15 LAB — COMPREHENSIVE METABOLIC PANEL
ALK PHOS: 64 U/L (ref 39–117)
ALT: 16 U/L (ref 0–53)
AST: 16 U/L (ref 0–37)
Albumin: 4.1 g/dL (ref 3.5–5.2)
BUN: 16 mg/dL (ref 6–23)
CALCIUM: 9.5 mg/dL (ref 8.4–10.5)
CHLORIDE: 104 meq/L (ref 96–112)
CO2: 29 mEq/L (ref 19–32)
Creat: 0.95 mg/dL (ref 0.50–1.35)
Glucose, Bld: 83 mg/dL (ref 70–99)
POTASSIUM: 4.2 meq/L (ref 3.5–5.3)
Sodium: 138 mEq/L (ref 135–145)
TOTAL PROTEIN: 6.9 g/dL (ref 6.0–8.3)
Total Bilirubin: 0.3 mg/dL (ref 0.2–1.2)

## 2014-08-22 ENCOUNTER — Ambulatory Visit
Admission: RE | Admit: 2014-08-22 | Discharge: 2014-08-22 | Disposition: A | Discharge status: Home / Self Care | Payer: BC Managed Care – PPO | Source: Ambulatory Visit | Attending: Family Medicine | Admitting: Family Medicine

## 2014-08-22 DIAGNOSIS — R109 Unspecified abdominal pain: Secondary | ICD-10-CM

## 2014-10-05 ENCOUNTER — Encounter: Payer: Self-pay | Admitting: Family Medicine

## 2014-10-23 ENCOUNTER — Ambulatory Visit (INDEPENDENT_AMBULATORY_CARE_PROVIDER_SITE_OTHER): Payer: BLUE CROSS/BLUE SHIELD | Admitting: Family Medicine

## 2014-10-23 ENCOUNTER — Telehealth: Payer: Self-pay | Admitting: Family Medicine

## 2014-10-23 ENCOUNTER — Encounter: Payer: Self-pay | Admitting: Family Medicine

## 2014-10-23 VITALS — BP 137/78 | HR 77 | Temp 98.2°F | Resp 16 | Ht 75.5 in | Wt 212.0 lb

## 2014-10-23 DIAGNOSIS — R10A0 Flank pain, unspecified side: Secondary | ICD-10-CM

## 2014-10-23 DIAGNOSIS — R109 Unspecified abdominal pain: Secondary | ICD-10-CM

## 2014-10-23 DIAGNOSIS — R10A1 Flank pain, right side: Secondary | ICD-10-CM

## 2014-10-23 DIAGNOSIS — F411 Generalized anxiety disorder: Secondary | ICD-10-CM

## 2014-10-23 LAB — CBC
HCT: 44.5 % (ref 39.0–52.0)
Hemoglobin: 15.6 g/dL (ref 13.0–17.0)
MCH: 30.8 pg (ref 26.0–34.0)
MCHC: 35.1 g/dL (ref 30.0–36.0)
MCV: 87.9 fL (ref 78.0–100.0)
MPV: 9.4 fL (ref 8.6–12.4)
Platelets: 277 10*3/uL (ref 150–400)
RBC: 5.06 MIL/uL (ref 4.22–5.81)
RDW: 13.7 % (ref 11.5–15.5)
WBC: 6.3 10*3/uL (ref 4.0–10.5)

## 2014-10-23 LAB — AMYLASE: AMYLASE: 64 U/L (ref 0–105)

## 2014-10-23 LAB — COMPREHENSIVE METABOLIC PANEL
ALBUMIN: 4.1 g/dL (ref 3.5–5.2)
ALK PHOS: 59 U/L (ref 39–117)
ALT: 21 U/L (ref 0–53)
AST: 19 U/L (ref 0–37)
BUN: 15 mg/dL (ref 6–23)
CHLORIDE: 103 meq/L (ref 96–112)
CO2: 26 mEq/L (ref 19–32)
CREATININE: 1 mg/dL (ref 0.50–1.35)
Calcium: 9.4 mg/dL (ref 8.4–10.5)
GLUCOSE: 92 mg/dL (ref 70–99)
Potassium: 4.3 mEq/L (ref 3.5–5.3)
Sodium: 139 mEq/L (ref 135–145)
TOTAL PROTEIN: 7 g/dL (ref 6.0–8.3)
Total Bilirubin: 0.5 mg/dL (ref 0.2–1.2)

## 2014-10-23 LAB — LIPASE: LIPASE: 17 U/L (ref 0–75)

## 2014-10-23 MED ORDER — METHOCARBAMOL 500 MG PO TABS: 500.0000 mg | ORAL_TABLET | Freq: Four times a day (QID) | ORAL | Status: DC

## 2014-10-23 NOTE — Patient Instructions (Signed)
Eat a bland diet- bread, noodles, rice, bananas, applesauce, crackers Try ibuprofen 600 mg every 8 hours for 3 days.

## 2014-10-23 NOTE — Telephone Encounter (Signed)
Called him back because I was concerned about his reported abdominal pain.  He states that he wants to come in tomorrow morning and that he cannot come in today.  He notes that his pain is worse with eating.  He will come in today and will see Tor Netters, NP by appt at 3:45 pm

## 2014-10-23 NOTE — Progress Notes (Signed)
Subjective:    Patient ID: Steven Raymond, male    DOB: 1966/08/29, 49 y.o.   MRN: [id number]  HPI This is a very pleasant 49 yo male who patient presents today with greater than 1 month history of right sided pain. He spoke with Dr. Lorelei Pont earlier today and presents for labs/evaluation. He always notices the pain, but it is more sharp and painful with sitting/driving. He especially notices it after sitting for a long time on his sofa, which sinks in on the right side where he always sits. He states it feel like a muscle pain. He had several days of severe cramping and diarrhea last week after visiting family with small children who were sick.The diarrhea has resolved and the last episode was several days ago. The abdominal/side pain started prior to diarrhea. He has noticed that ibuprofen helped the side pain. The pain does not radiate. It is occasionally sharp, but mostly achy. It is localized to a small area and is always in the same place. He can not recall any rib trauma/injury or any heavy lifting prior to pain starting. He has had no illness with cough. He has a history of small kidney stones, but reports that this pain is different.   Looking back at chart, the patient has complained about this pain off and on since 4/15. He had a negative abdominal ultrasound 08/22/14.  CBC/CMP 11/15 were normal.   Patient has a history of anxiety and reports taking ativan occasionally with good relief of symptoms.   Review of Systems  Constitutional: Negative for fever, chills and unexpected weight change.  Respiratory: Negative for cough and shortness of breath.   Cardiovascular: Negative for palpitations and leg swelling.  Gastrointestinal: Negative for nausea, vomiting, diarrhea and constipation. Abdominal pain: right side/flank pain.  Genitourinary: Positive for flank pain. Negative for dysuria, frequency and difficulty urinating.  Psychiatric/Behavioral: The patient is nervous/anxious.         Objective:   Physical Exam  Constitutional: He is oriented to person, place, and time. He appears well-developed and well-nourished.  HENT:  Head: Normocephalic and atraumatic.  Eyes: Conjunctivae are normal.  Neck: Normal range of motion. Neck supple.  Cardiovascular: Normal rate, regular rhythm and normal heart sounds.   Pulmonary/Chest: Effort normal and breath sounds normal. No respiratory distress. He has no wheezes. He has no rales.  Abdominal: Soft. Bowel sounds are normal. He exhibits no distension and no mass. There is no tenderness. There is no rebound, no guarding and no CVA tenderness.    Unable to reproduce his pain with palpation or abdominal muscle flexion/rotation.   Musculoskeletal: Normal range of motion.  Neurological: He is alert and oriented to person, place, and time.  Skin: Skin is warm and dry.  Psychiatric: He has a normal mood and affect. His behavior is normal. Judgment and thought content normal.  Vitals reviewed.  BP 137/78 mmHg  Pulse 77  Temp(Src) 98.2 F (36.8 C) (Oral)  Resp 16  Ht 6' 3.5" (1.918 m)  Wt 212 lb (96.163 kg)  BMI 26.14 kg/m2  SpO2 98%  Wt Readings from Last 3 Encounters:  10/23/14 212 lb (96.163 kg)  08/14/14 215 lb (97.523 kg)  01/20/14 207 lb 11.2 oz (94.212 kg)      Assessment & Plan:  1. Right flank pain - CBC - Comprehensive metabolic panel - Amylase - Lipase - will determine follow up based on labs and how he is feeling in the next couple of days  2.  Generalized anxiety disorder - Continue prn ativan  3. Flank pain -Reassured patient of normal abdominal ultrasound -he has used methocarbamol in the past and may have some at home, will send in a prescription in case he is out.  - have instructed him to use ibuprofen 600 mg q8 hours for 3 days as previous sporadic doses have improved his pain.   - methocarbamol (ROBAXIN) 500 MG tablet; Take 1 tablet (500 mg total) by mouth 4 (four) times daily. Use as needed for  muscle pain  Dispense: 30 tablet; Refill: 0   Elby Beck, FNP-BC  [facility]  10/24/2014 9:25 PM

## 2015-01-05 ENCOUNTER — Encounter: Payer: Self-pay | Admitting: Family Medicine

## 2015-01-05 DIAGNOSIS — F411 Generalized anxiety disorder: Secondary | ICD-10-CM

## 2015-01-05 DIAGNOSIS — R109 Unspecified abdominal pain: Secondary | ICD-10-CM

## 2015-01-08 MED ORDER — LORAZEPAM 1 MG PO TABS: ORAL_TABLET | ORAL | Status: DC

## 2015-01-10 ENCOUNTER — Encounter: Payer: Self-pay | Admitting: Gastroenterology

## 2015-03-06 ENCOUNTER — Encounter: Payer: Self-pay | Admitting: Gastroenterology

## 2015-03-06 ENCOUNTER — Ambulatory Visit (INDEPENDENT_AMBULATORY_CARE_PROVIDER_SITE_OTHER): Payer: BLUE CROSS/BLUE SHIELD | Admitting: Gastroenterology

## 2015-03-06 ENCOUNTER — Ambulatory Visit: Payer: Self-pay | Admitting: Gastroenterology

## 2015-03-06 VITALS — BP 108/62 | HR 64 | Ht 75.5 in | Wt 198.6 lb

## 2015-03-06 DIAGNOSIS — R109 Unspecified abdominal pain: Secondary | ICD-10-CM

## 2015-03-06 NOTE — Patient Instructions (Addendum)
Your pain is not from gallbladder or GI origin. We have scheduled your MRI lumbo-sacral spine at [facility] on 03/13/15 at 9:00pm. Please go directly to the Radiology department.

## 2015-03-06 NOTE — Progress Notes (Signed)
HPI: This is a  very pleasant 49 year old man   who was referred to me by Copland, Gay Filler, MD  to evaluate  right flank pain .    Chief complaint is right flank pain  Has had pains as far back as 6-7 months ago  Drives a lot and his pain is deathly worse after driving  Seems to flare after fatty meals  Seems to be worse after driving  No postprandial abdominal pains.  Eats very randomly thorugout the day  Has lost 10-15 pounds in past 2 months- has been cutting out fat.  Pants fitting loose.  No real nausea  signficant low back pains.  Has kidney stone trouble.  Usually associated with testicular pains as well.  Right flank, really flank  Started eating pomegranates around the same time.   [place] 08/2014: 1. Inhomogeneous liver may indicate fatty infiltration. Correlatewith LFTs.2. 8 mm echogenic focus in the right lobe of liver may representhemangioma.3. No gallstones.4. The pancreas is obscured by bowel gas. Labs 10/2014: cbc, cmet, lipase all normal.   Review of systems: Pertinent positive and negative review of systems were noted in the above HPI section. Complete review of systems was performed and was otherwise normal.   Past Medical History  Diagnosis Date  . Kidney stones     History reviewed. No pertinent past surgical history.  Current Outpatient Prescriptions  Medication Sig Dispense Refill  . LORazepam (ATIVAN) 1 MG tablet Take 1/2 or 1 twice a day as needed for anxiety 30 tablet 0   No current facility-administered medications for this visit.    Allergies as of 03/06/2015  . (No Known Allergies)    History reviewed. No pertinent family history.  History   Social History  . Marital Status: Unknown    Spouse Name: N/A  . Number of Children: N/A  . Years of Education: N/A   Occupational History  . Not on file.   Social History Main Topics  . Smoking status: Former Research scientist (life sciences)  . Smokeless tobacco: Not on file  . Alcohol Use: 0.0 oz/week     0 Standard drinks or equivalent per week  . Drug Use: Yes  . Sexual Activity: Yes   Other Topics Concern  . Not on file   Social History Narrative     Physical Exam: BP 108/62 mmHg  Pulse 64  Ht 6' 3.5" (1.918 m)  Wt 198 lb 9.6 oz (90.084 kg)  BMI 24.49 kg/m2 Constitutional: generally well-appearing Psychiatric: alert and oriented x3 Eyes: extraocular movements intact Mouth: oral pharynx moist, no lesions Neck: supple no lymphadenopathy Cardiovascular: heart regular rate and rhythm Lungs: clear to auscultation bilaterally Abdomen: soft, nontender, nondistended, no obvious ascites, no peritoneal signs, normal bowel sounds Extremities: no lower extremity edema bilaterally Skin: no lesions on visible extremities   Assessment and plan: 49 y.o. male with  right flank pain that is not gallbladder or GI related  His pain is clearly positional, located right flank more along the back side of the right flank than the anterior side. He has sciatic pains down his right leg. He knows he has significant lumbosacral spinal disease from evaluations many years ago. He does not believe these had an MRI in 10 or 15 years. The most likely this is spinal related discomforts. Perhaps kidney stone disease however he says whenever he has a kidney stone he has testicular pain as well and the pains are not nearly so chronic and positional. I tend to agree with  him. We will arrange for lumbosacral spine test MRI and I'll communicate those results with him. This does not at all seen gallbladder related.   Owens Loffler, MD [facility] 03/06/2015, 8:53 AM  Cc: Darreld Mclean, MD

## 2015-03-13 ENCOUNTER — Ambulatory Visit (HOSPITAL_COMMUNITY): Payer: BLUE CROSS/BLUE SHIELD

## 2015-06-27 ENCOUNTER — Encounter: Payer: Self-pay | Admitting: Family Medicine

## 2015-06-27 DIAGNOSIS — D229 Melanocytic nevi, unspecified: Secondary | ICD-10-CM

## 2015-08-22 ENCOUNTER — Ambulatory Visit (INDEPENDENT_AMBULATORY_CARE_PROVIDER_SITE_OTHER): Payer: BLUE CROSS/BLUE SHIELD

## 2015-08-22 ENCOUNTER — Ambulatory Visit (INDEPENDENT_AMBULATORY_CARE_PROVIDER_SITE_OTHER): Payer: BLUE CROSS/BLUE SHIELD | Admitting: Family Medicine

## 2015-08-22 ENCOUNTER — Encounter: Payer: Self-pay | Admitting: Family Medicine

## 2015-08-22 VITALS — BP 113/74 | HR 64 | Temp 98.4°F | Resp 16 | Ht 75.5 in | Wt 203.0 lb

## 2015-08-22 DIAGNOSIS — M5441 Lumbago with sciatica, right side: Secondary | ICD-10-CM | POA: Diagnosis not present

## 2015-08-22 MED ORDER — PREDNISONE 20 MG PO TABS: ORAL_TABLET | ORAL | Status: DC

## 2015-08-22 NOTE — Patient Instructions (Signed)
Your plain films of your back looked good. I will get you set up for an MRI. In the meantime try taking the prednisone as we discussed; this may help with some of your symptoms Keep [place] posted as to how you are doing  While you are on prednisone do not take ibuprofen or naproxen but tylenol is ok if needed

## 2015-08-22 NOTE — Progress Notes (Signed)
[facility] [address], [place] [address] [phone]  Date:  08/22/2015   Name:  Steven Raymond   DOB:  [birth year]   MRN:  [id number]  PCP:  Lamar Blinks, MD    Chief Complaint: Tingling   History of Present Illness:  Steven Raymond is a 49 y.o. very pleasant male patient who presents with the following:  He is here today with a concern about possible nerve related right flank pain.  He has noted pain in the inguinal area on the right.  He also has lower back pain intermittently He has an inversion table and has tried it; he is not sure if this is making a difference but it is not getting worse.   "I am aware of it virtually all the time,"but it varies a lot in intensity He has seen GI who did not feel like there was any GI issue here.    He did have an MRI about 15 years ago that showed some degenerative change in his back- lumbar area  He may get some numbness in the anterior right thigh, but he does not notice any weakness in his legs No metal in his body No bowel or bladder Patient Active Problem List   Diagnosis Date Noted  . Sleep pattern disturbance 02/02/2013  . DDD (degenerative disc disease), lumbar 02/02/2013  . Other and unspecified hyperlipidemia 02/02/2013    Past Medical History  Diagnosis Date  . Kidney stones     No past surgical history on file.  Social History  Substance Use Topics  . Smoking status: Former Research scientist (life sciences)  . Smokeless tobacco: None  . Alcohol Use: 0.0 oz/week    0 Standard drinks or equivalent per week    No family history on file.  No Known Allergies  Medication list has been reviewed and updated.  Current Outpatient Prescriptions on File Prior to Visit  Medication Sig Dispense Refill  . LORazepam (ATIVAN) 1 MG tablet Take 1/2 or 1 twice a day as needed for anxiety (Patient not taking: Reported on 08/22/2015) 30 tablet 0   No current facility-administered medications on file prior to visit.     Review of Systems: As per HPI- otherwise negative.   Physical Examination: Filed Vitals:   08/22/15 1313  BP: 113/74  Pulse: 64  Temp: 98.4 F (36.9 C)  Resp: 16   Filed Vitals:   08/22/15 1313  Height: 6' 3.5" (1.918 m)  Weight: 203 lb (92.08 kg)   Body mass index is 25.03 kg/(m^2). Ideal Body Weight: Weight in (lb) to have BMI = 25: 202.3  GEN: WDWN, NAD, Non-toxic, A & O x 3, looks well, tall build HEENT: Atraumatic, Normocephalic. Neck supple. No masses, No LAD. Ears and Nose: No external deformity. CV: RRR, No M/G/R. No JVD. No thrill. No extra heart sounds. PULM: CTA B, no wheezes, crackles, rhonchi. No retractions. No resp. distress. No accessory muscle use. ABD: S, NT, ND. No rebound. No HSM.  He notes tenderness over the right inguinal canal that has been present for a year EXTR: No c/c/e NEURO Normal gait.  PSYCH: Normally interactive. Conversant. Not depressed or anxious appearing.  Calm demeanor.  Normal BLE strength, sensation and DTR, negative SLR bilaterally  [facility] reading (PRIMARY) by  Dr. Lorelei Pont. Lumbar spine: negative  LUMBAR SPINE - COMPLETE 4+ VIEW  COMPARISON: None.  FINDINGS: There is no fracture or bone destruction. No disc space narrowing. Partial sacralization of L5. No subluxation. No visible facet  arthritis.  IMPRESSION: Partial sacralization of L5. Otherwise, normal lumbar spine. Assessment and Plan: Midline low back pain with right-sided sciatica - Plan: DG Lumbar Spine Complete, predniSONE (DELTASONE) 20 MG tablet  Here today with persistent pain in his right groin and intermittent back pain for a year or more.  Will try a course of prednisone.  He has been to see GI and there was no GI cause for his pain.  He would like to pursue and MRI; will arrange this for him  Signed Lamar Blinks, MD

## 2015-08-28 ENCOUNTER — Encounter: Payer: Self-pay | Admitting: Family Medicine

## 2015-08-28 DIAGNOSIS — M545 Low back pain: Secondary | ICD-10-CM

## 2015-08-29 MED ORDER — TRAMADOL HCL 50 MG PO TABS
50.0000 mg | ORAL_TABLET | Freq: Three times a day (TID) | ORAL | Status: DC | PRN
Start: 2015-08-29 — End: 2016-11-05

## 2015-09-04 ENCOUNTER — Encounter: Payer: Self-pay | Admitting: Family Medicine

## 2015-09-04 DIAGNOSIS — M5441 Lumbago with sciatica, right side: Secondary | ICD-10-CM

## 2015-09-18 NOTE — Addendum Note (Signed)
Addended by: Lamar Blinks C on: 09/18/2015 05:45 AM   Modules accepted: Orders

## 2015-10-05 ENCOUNTER — Other Ambulatory Visit: Payer: BLUE CROSS/BLUE SHIELD

## 2015-10-26 ENCOUNTER — Encounter: Payer: Self-pay | Admitting: Family Medicine

## 2015-10-31 ENCOUNTER — Encounter: Payer: Self-pay | Admitting: Family Medicine

## 2016-05-22 ENCOUNTER — Encounter: Payer: Self-pay | Admitting: Family Medicine

## 2016-05-22 DIAGNOSIS — M25512 Pain in left shoulder: Secondary | ICD-10-CM

## 2016-05-31 DIAGNOSIS — M436 Torticollis: Secondary | ICD-10-CM | POA: Diagnosis not present

## 2016-05-31 DIAGNOSIS — M75102 Unspecified rotator cuff tear or rupture of left shoulder, not specified as traumatic: Secondary | ICD-10-CM | POA: Diagnosis not present

## 2016-05-31 DIAGNOSIS — M25512 Pain in left shoulder: Secondary | ICD-10-CM | POA: Diagnosis not present

## 2016-05-31 DIAGNOSIS — M542 Cervicalgia: Secondary | ICD-10-CM | POA: Diagnosis not present

## 2016-06-30 DIAGNOSIS — M5412 Radiculopathy, cervical region: Secondary | ICD-10-CM | POA: Diagnosis not present

## 2016-06-30 DIAGNOSIS — M542 Cervicalgia: Secondary | ICD-10-CM | POA: Diagnosis not present

## 2016-06-30 DIAGNOSIS — M5030 Other cervical disc degeneration, unspecified cervical region: Secondary | ICD-10-CM | POA: Diagnosis not present

## 2016-06-30 DIAGNOSIS — M2480 Other specific joint derangements of unspecified joint, not elsewhere classified: Secondary | ICD-10-CM | POA: Diagnosis not present

## 2016-07-17 DIAGNOSIS — M5412 Radiculopathy, cervical region: Secondary | ICD-10-CM | POA: Diagnosis not present

## 2016-07-24 DIAGNOSIS — M5412 Radiculopathy, cervical region: Secondary | ICD-10-CM | POA: Diagnosis not present

## 2016-08-12 DIAGNOSIS — M5412 Radiculopathy, cervical region: Secondary | ICD-10-CM | POA: Diagnosis not present

## 2016-08-19 DIAGNOSIS — Z23 Encounter for immunization: Secondary | ICD-10-CM | POA: Diagnosis not present

## 2016-11-05 ENCOUNTER — Encounter: Payer: Self-pay | Admitting: Family Medicine

## 2016-11-05 ENCOUNTER — Ambulatory Visit (INDEPENDENT_AMBULATORY_CARE_PROVIDER_SITE_OTHER): Payer: BLUE CROSS/BLUE SHIELD | Admitting: Family Medicine

## 2016-11-05 VITALS — BP 132/86 | HR 72 | Temp 97.9°F | Resp 20 | Wt 212.2 lb

## 2016-11-05 DIAGNOSIS — J40 Bronchitis, not specified as acute or chronic: Secondary | ICD-10-CM | POA: Diagnosis not present

## 2016-11-05 MED ORDER — AZITHROMYCIN 250 MG PO TABS: ORAL_TABLET | ORAL | 0 refills | Status: DC

## 2016-11-05 NOTE — Patient Instructions (Signed)
You sound like you have a bronchitis.  Rest, hydrate.  I have prescribed you a z-pack to cover you, considering you have been frightening this for over a week, you need a little help to get over it.     Acute Bronchitis, Adult Acute bronchitis is when air tubes (bronchi) in the lungs suddenly get swollen. The condition can make it hard to breathe. It can also cause these symptoms:  A cough.  Coughing up clear, yellow, or green mucus.  Wheezing.  Chest congestion.  Shortness of breath.  A fever.  Body aches.  Chills.  A sore throat. Follow these instructions at home: Medicines  Take over-the-counter and prescription medicines only as told by your doctor.  If you were prescribed an antibiotic medicine, take it as told by your doctor. Do not stop taking the antibiotic even if you start to feel better. General instructions  Rest.  Drink enough fluids to keep your pee (urine) clear or pale yellow.  Avoid smoking and secondhand smoke. If you smoke and you need help quitting, ask your doctor. Quitting will help your lungs heal faster.  Use an inhaler, cool mist vaporizer, or humidifier as told by your doctor.  Keep all follow-up visits as told by your doctor. This is important. How is this prevented? To lower your risk of getting this condition again:  Wash your hands often with soap and water. If you cannot use soap and water, use hand sanitizer.  Avoid contact with people who have cold symptoms.  Try not to touch your hands to your mouth, nose, or eyes.  Make sure to get the flu shot every year. Contact a doctor if:  Your symptoms do not get better in 2 weeks. Get help right away if:  You cough up blood.  You have chest pain.  You have very bad shortness of breath.  You become dehydrated.  You faint (pass out) or keep feeling like you are going to pass out.  You keep throwing up (vomiting).  You have a very bad headache.  Your fever or chills gets  worse. This information is not intended to replace advice given to you by your health care provider. Make sure you discuss any questions you have with your health care provider. Document Released: 03/10/2008 Document Revised: 04/30/2016 Document Reviewed: 03/12/2016 Elsevier Interactive Patient Education  2017 [employer].

## 2016-11-05 NOTE — Progress Notes (Signed)
    Devang Seccombe , [birth year], 51 y.o., male MRN: [id number] Patient Care Team    Relationship Specialty Notifications Start End  Darreld Mclean, MD PCP - General Family Medicine  03/06/15     CC: congestion  Subjective: Pt presents for an acute OV with complaints of chest congestion of over 1 week duration.  Associated symptoms include low grade fever, chest burning with deep cough, mild shortness of breath at times (not consistent). He denies chills, nausea, vomit, abd pain. He endorses one episode of diarrhea last week. He has had his flu shot. He has tried nothing to improve his symptoms.   No Known Allergies Social History  Substance Use Topics  . Smoking status: Former Research scientist (life sciences)  . Smokeless tobacco: Never Used  . Alcohol use 0.0 oz/week   Past Medical History:  Diagnosis Date  . Kidney stones    History reviewed. No pertinent surgical history. History reviewed. No pertinent family history. Allergies as of 11/05/2016   No Known Allergies     Medication List    as of 11/05/2016  2:36 PM   You have not been prescribed any medications.     No results found for this or any previous visit (from the past 24 hour(s)). No results found.   ROS: Negative, with the exception of above mentioned in HPI   Objective:  BP 132/86 (BP Location: Right Arm, Patient Position: Sitting, Cuff Size: Large)   Pulse 72   Temp 97.9 F (36.6 C)   Resp 20   Wt 212 lb 4 oz (96.3 kg)   SpO2 97%   BMI 26.18 kg/m  Body mass index is 26.18 kg/m. Gen: Afebrile. No acute distress. Nontoxic in appearance, well developed, well nourished.  HENT: AT. [place]. Bilateral TM visualized wnl. MMM, no oral lesions. Bilateral nares mild erythema, no swelling or drainage. Throat without erythema or exudates. Mild hoarseness, no cough appreciated during visit.  Eyes:Pupils Equal Round Reactive to light, Extraocular movements intact,  Conjunctiva without redness, discharge or icterus. Neck/lymp/endocrine:  Supple,no lymphadenopathy CV: RRR  Chest: CTAB, no wheeze or crackles. Good air movement, normal resp effort.  Abd: Soft. NTND. BS present.   Assessment/Plan: Zequan Popko is a 51 y.o. male present for acute OV for  Bronchitis - appears well on exam with only mild uri like symptoms, h/o is consistent with bronchitis type symptoms. Treat with z-pack and OTC therapy.  Rest, hydrate, Z-pack.  F/U PRN   electronically signed by:  Howard Pouch, DO  [place]

## 2017-02-23 IMAGING — CR DG LUMBAR SPINE COMPLETE 4+V
4 series · 4 of 4 positions shown · non-contrast
Comparison: None.

CLINICAL DATA: Low back pain.  Right sciatica.

EXAM:
LUMBAR SPINE - COMPLETE 4+ VIEW

[left lateral]
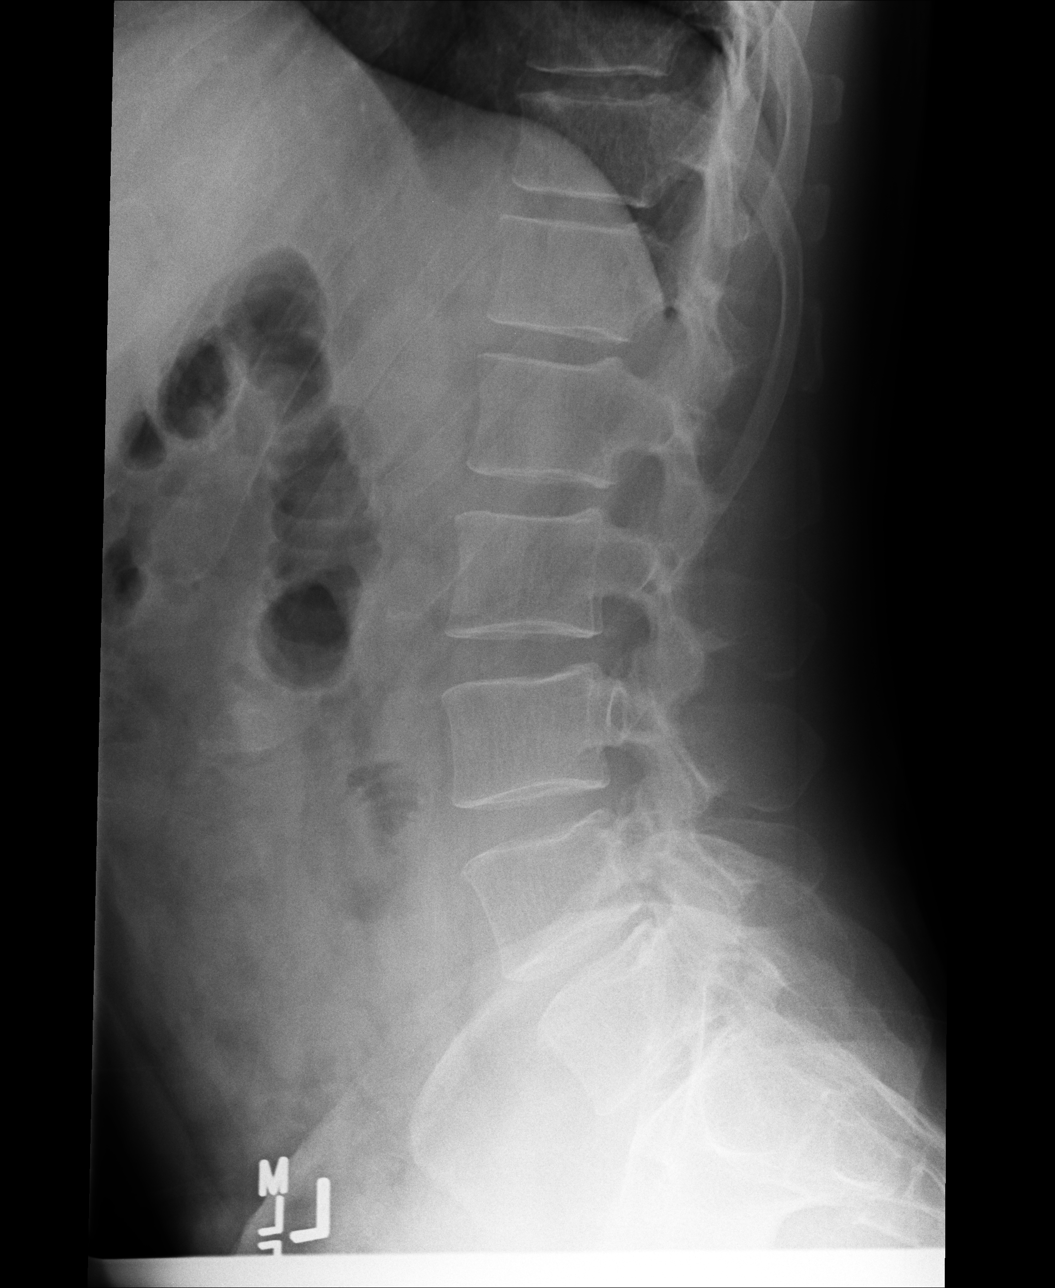

[oblique (1 of 2)]
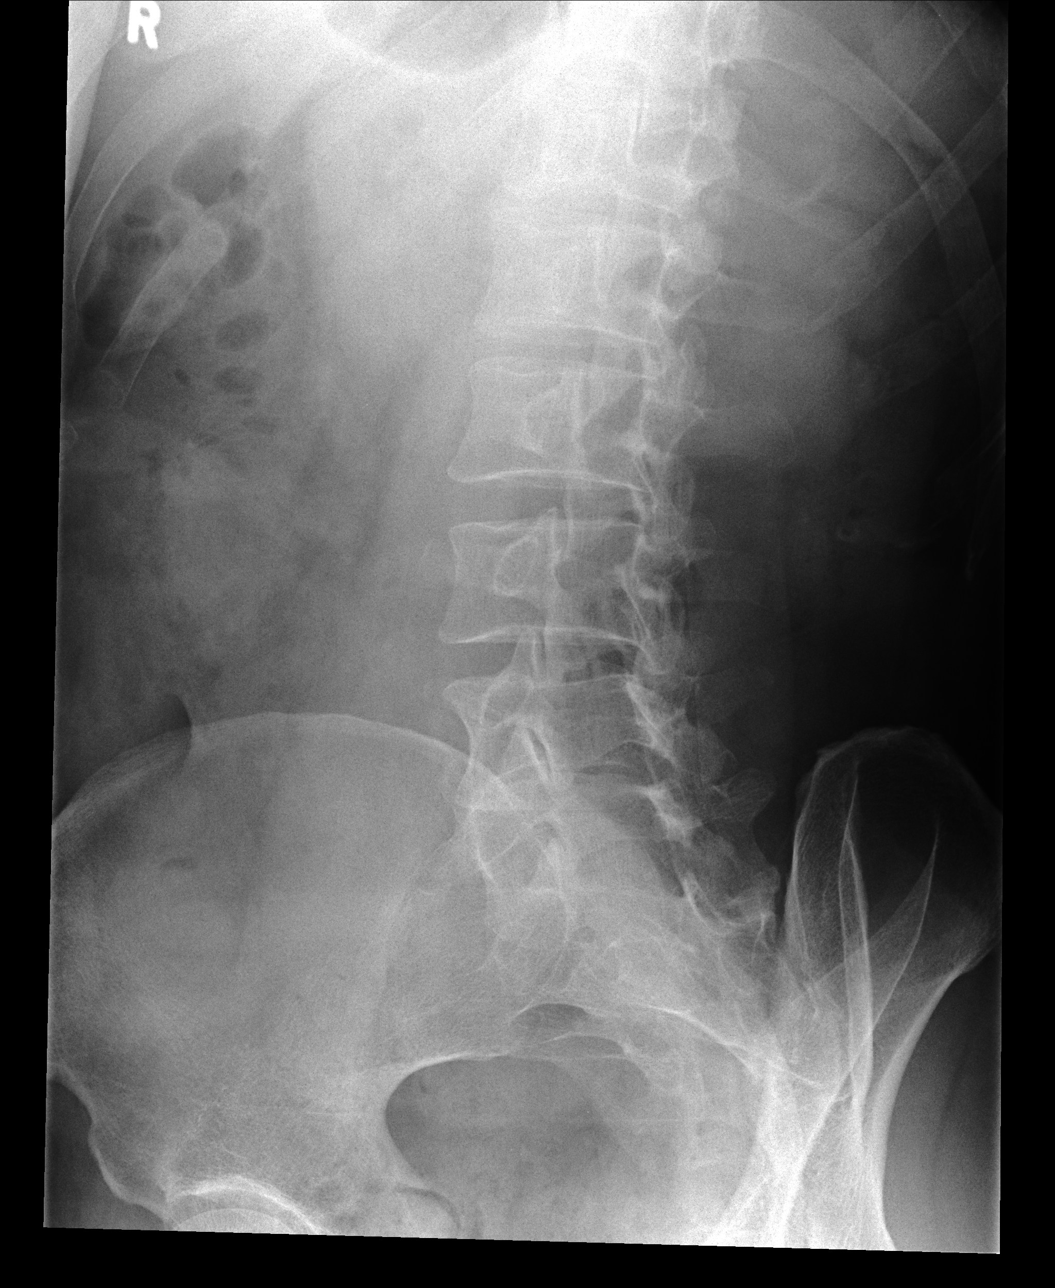

[AP]
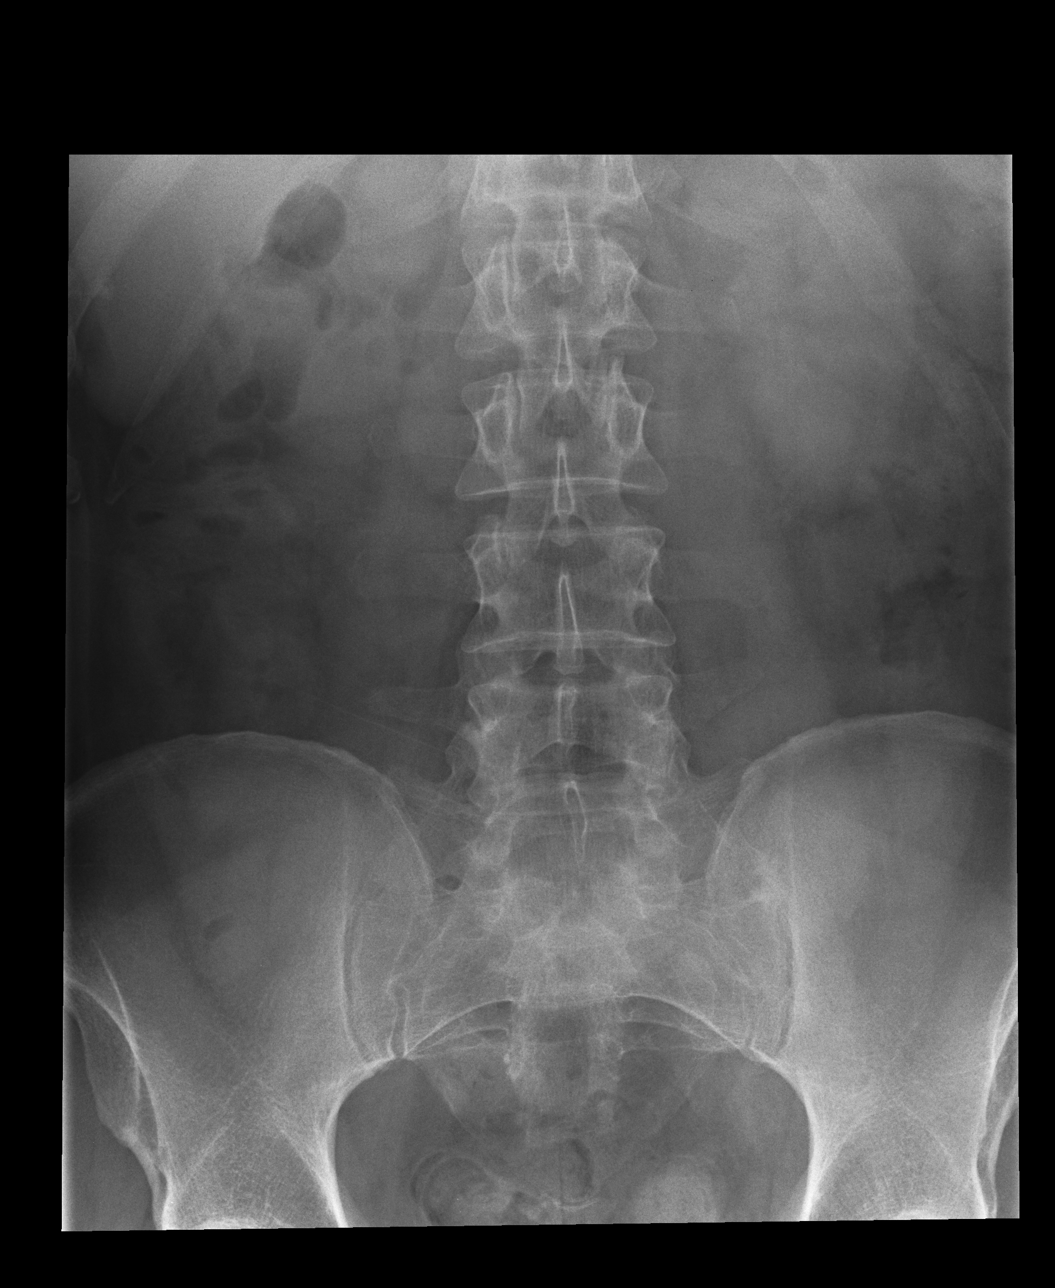

[oblique (2 of 2)]
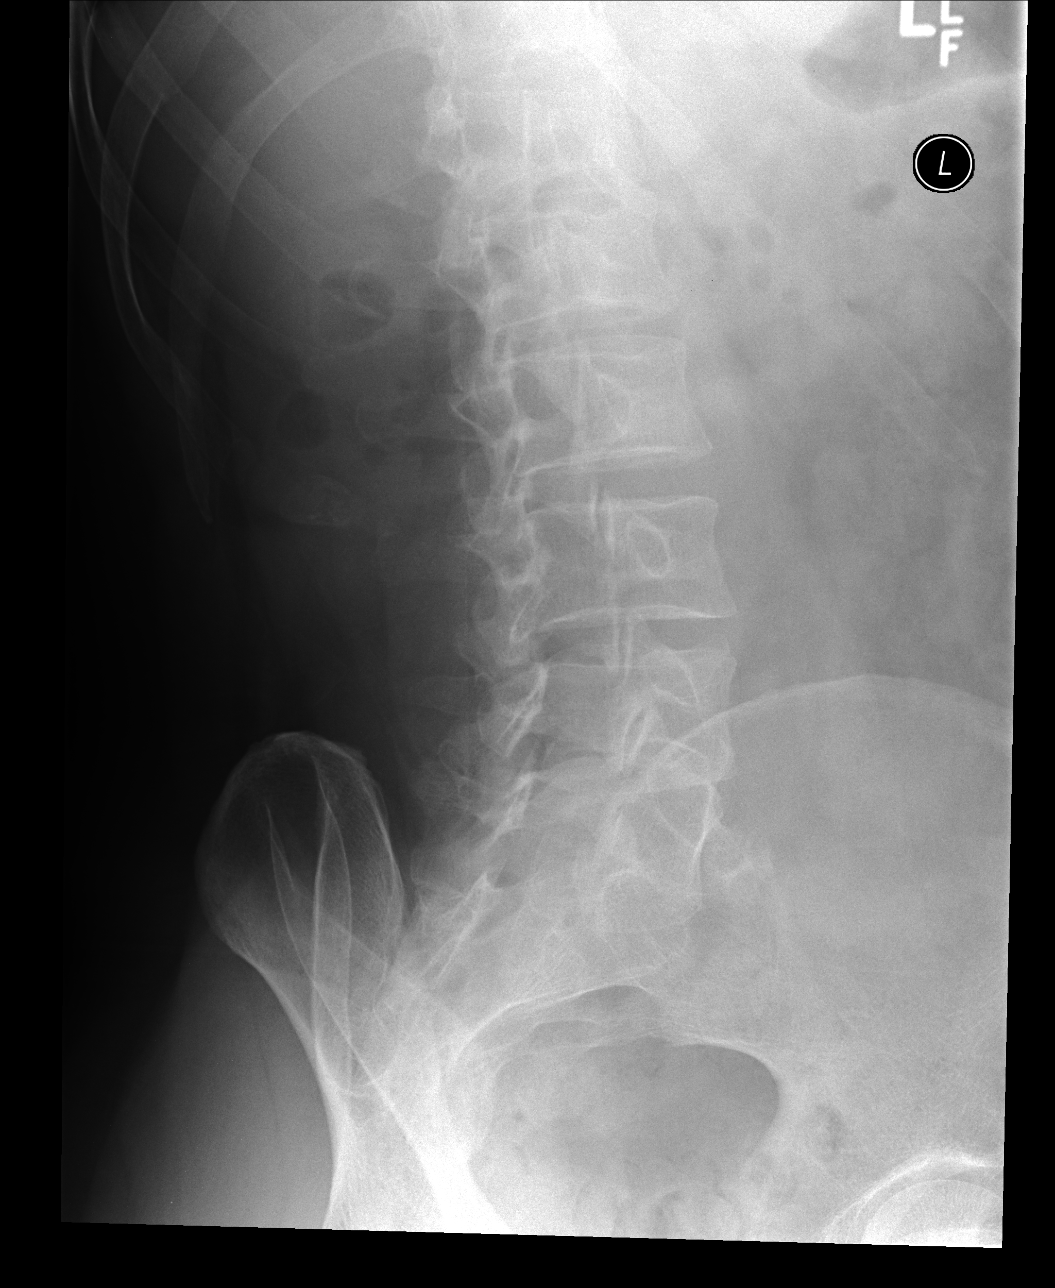

[4 of 4 positions shown; findings below may reference images not displayed]

FINDINGS: There is no fracture or bone destruction. No disc space narrowing.
Partial sacralization of L5. No subluxation. No visible facet
arthritis.
IMPRESSION: Partial sacralization of L5.  Otherwise, normal lumbar spine.

## 2017-06-01 ENCOUNTER — Encounter: Payer: Self-pay | Admitting: Family Medicine

## 2017-06-01 ENCOUNTER — Ambulatory Visit (INDEPENDENT_AMBULATORY_CARE_PROVIDER_SITE_OTHER): Payer: BLUE CROSS/BLUE SHIELD | Admitting: Family Medicine

## 2017-06-01 VITALS — BP 107/74 | HR 87 | Temp 98.0°F | Resp 20 | Wt 199.2 lb

## 2017-06-01 DIAGNOSIS — J029 Acute pharyngitis, unspecified: Secondary | ICD-10-CM

## 2017-06-01 LAB — POCT RAPID STREP A (OFFICE): Rapid Strep A Screen: NEGATIVE

## 2017-06-01 NOTE — Progress Notes (Signed)
Steven Raymond , 25-Aug-1966, 51 y.o., male MRN: [id number] Patient Care Team    Relationship Specialty Notifications Start End  Steven Raymond PCP - General Family Medicine  03/06/15    Chief Complaint  Patient presents with  . Sore Throat    started Friday ,pain with swallowing,body aches,low grade fever,hoarseness    Subjective:  Pt presents for sore throat of 4 days duration with increase phlegm. Patient reports his symptoms started on Friday, the right side of his throat became swollen and sore. He sustained a low-grade fever and hoarseness along with body aches. He denies sinus congestion, rash, abdominal pain or diarrhea. He was not exposed to any known sick contacts. He states he was unable to eat over the weekend and lost 8 pounds. He has taken NyQuil to help him sleep. He denies any wheezing or breathing difficulties. He reports he is feeling much improved today.  No Known Allergies Social History  Substance Use Topics  . Smoking status: Former Research scientist (life sciences)  . Smokeless tobacco: Never Used  . Alcohol use 0.0 oz/week   Past Medical History:  Diagnosis Date  . Kidney stones    History reviewed. No pertinent surgical history. History reviewed. No pertinent family history. Allergies as of 06/01/2017   No Known Allergies     Medication List    as of 06/01/2017 11:28 AM   You have not been prescribed any medications.          Discharge Care Instructions        Start     Ordered   06/01/17 0000  POCT rapid strep A     06/01/17 1119      No results found for this or any previous visit (from the past 24 hour(s)). No results found.   ROS: Negative, with the exception of above mentioned in HPI   Objective:  BP 107/74 (BP Location: Left Arm, Patient Position: Sitting, Cuff Size: Large)   Pulse 87   Temp 98 F (36.7 C)   Resp 20   Wt 199 lb 4 oz (90.4 kg)   SpO2 98%   BMI 24.58 kg/m  Body mass index is 24.58 kg/m. Gen: Afebrile. No acute distress.    HENT: AT. [place]. Bilateral TM visualized and normal in appearance. MMM. Bilateral nares Without erythema, swelling or drainage. Throat with moderate erythema R>L, no exudates. No cough or hoarseness present.  Eyes:Pupils Equal Round Reactive to light, Extraocular movements intact,  Conjunctiva without redness, discharge or icterus. Neck/lymp/endocrine: Supple, mild right anterior cervical lymphadenopathy CV: RRR Chest: CTAB, no wheeze or crackles Abd: Soft. NTND. BS present.  Skin: No rashes, purpura or petechiae.  Neuro:Normal gait. PERLA. EOMi. Alert. Oriented x3 Results for orders placed or performed in visit on 06/01/17 (from the past 24 hour(s))  POCT rapid strep A     Status: Normal   Collection Time: 06/01/17 11:29 AM  Result Value Ref Range   Rapid Strep A Screen Negative Negative     Assessment/Plan: Steven Raymond is a 50 y.o. male present for acute OV for  Sore throat/pharyngitis - Discussed with patient this is likely viral in nature. Would recommend over-the-counter supportive therapy at this time. - POCT rapid strep A ---> negative  - Upper Respiratory Culture Sent to be complete. - Patient was encouraged to rest, hydrate and NSAID use. If culture is positive for prescribe antibiotic. - Follow-up when necessary, sooner if worsening.    electronically signed by:  Steven Pouch, DO  [facility] - [place]

## 2017-06-01 NOTE — Patient Instructions (Signed)
Try over the counter regimens such as advil.  Nyquil use is ok.  Mucinex plain will help decrease mucous production.  Keep hydrated. Try G2 products to help with electrolytes.   Pharyngitis Pharyngitis is a sore throat (pharynx). There is redness, pain, and swelling of your throat. Follow these instructions at home:  Drink enough fluids to keep your pee (urine) clear or pale yellow.  Only take medicine as told by your doctor. ? You may get sick again if you do not take medicine as told. Finish your medicines, even if you start to feel better. ? Do not take aspirin.  Rest.  Rinse your mouth (gargle) with salt water ( tsp of salt per 1 qt of water) every 1-2 hours. This will help the pain.  If you are not at risk for choking, you can suck on hard candy or sore throat lozenges. Contact a doctor if:  You have large, tender lumps on your neck.  You have a rash.  You cough up green, yellow-brown, or bloody spit. Get help right away if:  You have a stiff neck.  You drool or cannot swallow liquids.  You throw up (vomit) or are not able to keep medicine or liquids down.  You have very bad pain that does not go away with medicine.  You have problems breathing (not from a stuffy nose). This information is not intended to replace advice given to you by your health care provider. Make sure you discuss any questions you have with your health care provider. Document Released: 03/10/2008 Document Revised: 02/28/2016 Document Reviewed: 05/30/2013 Elsevier Interactive Patient Education  2017 [employer].

## 2017-06-04 LAB — CULTURE, UPPER RESPIRATORY: Organism ID, Bacteria: NORMAL

## 2018-07-18 DIAGNOSIS — Z23 Encounter for immunization: Secondary | ICD-10-CM | POA: Diagnosis not present

## 2018-11-17 ENCOUNTER — Ambulatory Visit: Payer: BLUE CROSS/BLUE SHIELD | Admitting: Family Medicine

## 2018-12-01 ENCOUNTER — Ambulatory Visit (INDEPENDENT_AMBULATORY_CARE_PROVIDER_SITE_OTHER): Payer: BLUE CROSS/BLUE SHIELD | Admitting: Medical

## 2018-12-01 ENCOUNTER — Encounter: Payer: Self-pay | Admitting: Medical

## 2018-12-01 VITALS — BP 120/82 | HR 65 | Temp 97.7°F | Resp 16 | Ht 75.5 in | Wt 218.0 lb

## 2018-12-01 DIAGNOSIS — Z125 Encounter for screening for malignant neoplasm of prostate: Secondary | ICD-10-CM | POA: Diagnosis not present

## 2018-12-01 DIAGNOSIS — Z23 Encounter for immunization: Secondary | ICD-10-CM

## 2018-12-01 DIAGNOSIS — Z0000 Encounter for general adult medical examination without abnormal findings: Secondary | ICD-10-CM

## 2018-12-01 DIAGNOSIS — Z113 Encounter for screening for infections with a predominantly sexual mode of transmission: Secondary | ICD-10-CM | POA: Diagnosis not present

## 2018-12-01 DIAGNOSIS — Z1211 Encounter for screening for malignant neoplasm of colon: Secondary | ICD-10-CM

## 2018-12-01 NOTE — Patient Instructions (Signed)
For you wellness exam today I have ordered cbc, cmp, lipid panel,and hiv.  Vaccine given today tdap.  Recommend exercise and healthy diet.  We will let you know lab results as they come in.  Follow up date appointment will be determined after lab review.   Place gi referral for screening colonoscopy.   Preventive Care 40-64 Years, Male Preventive care refers to lifestyle choices and visits with your health care provider that can promote health and wellness. What does preventive care include?   A yearly physical exam. This is also called an annual well check.  Dental exams once or twice a year.  Routine eye exams. Ask your health care provider how often you should have your eyes checked.  Personal lifestyle choices, including: ? Daily care of your teeth and gums. ? Regular physical activity. ? Eating a healthy diet. ? Avoiding tobacco and drug use. ? Limiting alcohol use. ? Practicing safe sex. ? Taking low-dose aspirin every day starting at age 80. What happens during an annual well check? The services and screenings done by your health care provider during your annual well check will depend on your age, overall health, lifestyle risk factors, and family history of disease. Counseling Your health care provider may ask you questions about your:  Alcohol use.  Tobacco use.  Drug use.  Emotional well-being.  Home and relationship well-being.  Sexual activity.  Eating habits.  Work and work Statistician. Screening You may have the following tests or measurements:  Height, weight, and BMI.  Blood pressure.  Lipid and cholesterol levels. These may be checked every 5 years, or more frequently if you are over 1 years old.  Skin check.  Lung cancer screening. You may have this screening every year starting at age 41 if you have a 30-pack-year history of smoking and currently smoke or have quit within the past 15 years.  Colorectal cancer screening. All adults  should have this screening starting at age 51 and continuing until age 68. Your health care provider may recommend screening at age 52. You will have tests every 1-10 years, depending on your results and the type of screening test. People at increased risk should start screening at an earlier age. Screening tests may include: ? Guaiac-based fecal occult blood testing. ? Fecal immunochemical test (FIT). ? Stool DNA test. ? Virtual colonoscopy. ? Sigmoidoscopy. During this test, a flexible tube with a tiny camera (sigmoidoscope) is used to examine your rectum and lower colon. The sigmoidoscope is inserted through your anus into your rectum and lower colon. ? Colonoscopy. During this test, a long, thin, flexible tube with a tiny camera (colonoscope) is used to examine your entire colon and rectum.  Prostate cancer screening. Recommendations will vary depending on your family history and other risks.  Hepatitis C blood test.  Hepatitis B blood test.  Sexually transmitted disease (STD) testing.  Diabetes screening. This is done by checking your blood sugar (glucose) after you have not eaten for a while (fasting). You may have this done every 1-3 years. Discuss your test results, treatment options, and if necessary, the need for more tests with your health care provider. Vaccines Your health care provider may recommend certain vaccines, such as:  Influenza vaccine. This is recommended every year.  Tetanus, diphtheria, and acellular pertussis (Tdap, Td) vaccine. You may need a Td booster every 10 years.  Varicella vaccine. You may need this if you have not been vaccinated.  Zoster vaccine. You may need this after age  5.  Measles, mumps, and rubella (MMR) vaccine. You may need at least one dose of MMR if you were born in 1957 or later. You may also need a second dose.  Pneumococcal 13-valent conjugate (PCV13) vaccine. You may need this if you have certain conditions and have not been  vaccinated.  Pneumococcal polysaccharide (PPSV23) vaccine. You may need one or two doses if you smoke cigarettes or if you have certain conditions.  Meningococcal vaccine. You may need this if you have certain conditions.  Hepatitis A vaccine. You may need this if you have certain conditions or if you travel or work in places where you may be exposed to hepatitis A.  Hepatitis B vaccine. You may need this if you have certain conditions or if you travel or work in places where you may be exposed to hepatitis B.  Haemophilus influenzae type b (Hib) vaccine. You may need this if you have certain risk factors. Talk to your health care provider about which screenings and vaccines you need and how often you need them. This information is not intended to replace advice given to you by your health care provider. Make sure you discuss any questions you have with your health care provider. Document Released: 10/19/2015 Document Revised: 11/12/2017 Document Reviewed: 07/24/2015 Elsevier Interactive Patient Education  2019 [employer].

## 2018-12-01 NOTE — Progress Notes (Signed)
Subjective:    Patient ID: Steven Raymond, male    DOB: Oct 30, 1965, 53 y.o.   MRN: [id number]  HPI  Pt in for first time here at med center.  Pt not feeling any acute illness today.  Pt reports no chronic medical problems.   He does report diffuse joint pains. Xrays done in past show degenerative joint diseases. He prefers to smoke marijuana for pain. He states more than he should. He smokes excessively. He indicates could smoke less and still control pain.  He drinks socially with dinner. Steven Raymond is 2 drinks at a time.  Pt works Conservator, museum/gallery for [employer] He tries to exercise regularly. He tries to ride bike to work when he can/weather permitting. He has moderate healthy diet.    Review of Systems  Constitutional: Negative for chills, fatigue and fever.  HENT: Negative for congestion, drooling, ear pain, postnasal drip, sinus pressure and sinus pain.   Respiratory: Negative for cough, chest tightness, shortness of breath and wheezing.   Cardiovascular: Negative for chest pain and palpitations.  Gastrointestinal: Negative for abdominal pain, diarrhea, nausea and vomiting.  Genitourinary: Negative for dysuria, flank pain and urgency.  Musculoskeletal: Negative for back pain.  Skin: Negative for rash.  Neurological: Negative for dizziness, syncope, weakness and headaches.  Hematological: Negative for adenopathy. Does not bruise/bleed easily.  Psychiatric/Behavioral: Negative for behavioral problems and decreased concentration.    Past Medical History:  Diagnosis Date  . Kidney stones      Social History   Socioeconomic History  . Marital status: Single    Spouse name: Not on file  . Number of children: Not on file  . Years of education: Not on file  . Highest education level: Not on file  Occupational History  . Not on file  Social Needs  . Financial resource strain: Not on file  . Food insecurity:    Worry: Not on file    Inability: Not on file  .  Transportation needs:    Medical: Not on file    Non-medical: Not on file  Tobacco Use  . Smoking status: Former Research scientist (life sciences)  . Smokeless tobacco: Never Used  Substance and Sexual Activity  . Alcohol use: Yes    Alcohol/week: 0.0 standard drinks  . Drug use: Yes  . Sexual activity: Yes  Lifestyle  . Physical activity:    Days per week: Not on file    Minutes per session: Not on file  . Stress: Not on file  Relationships  . Social connections:    Talks on phone: Not on file    Gets together: Not on file    Attends religious service: Not on file    Active member of club or organization: Not on file    Attends meetings of clubs or organizations: Not on file    Relationship status: Not on file  . Intimate partner violence:    Fear of current or ex partner: Not on file    Emotionally abused: Not on file    Physically abused: Not on file    Forced sexual activity: Not on file  Other Topics Concern  . Not on file  Social History Narrative  . Not on file    No past surgical history on file.  No family history on file.  No Known Allergies  No current outpatient medications on file prior to visit.   No current facility-administered medications on file prior to visit.     BP 120/82   Pulse  65   Temp 97.7 F (36.5 C) (Oral)   Resp 16   Ht 6' 3.5" (1.918 m)   Wt 218 lb (98.9 kg)   SpO2 100%   BMI 26.89 kg/m       Objective:   Physical Exam  General Mental Status- Alert. General Appearance- Not in acute distress.   Skin General: Color- Normal Color. Moisture- Normal Moisture. Small scatterd tiny moles. No worrisome features.  Neck Carotid Arteries- Normal color. Moisture- Normal Moisture. No carotid bruits. No JVD.  Chest and Lung Exam Auscultation: Breath Sounds:-Normal.  Cardiovascular Auscultation:Rythm- Regular. Murmurs & Other Heart Sounds:Auscultation of the heart reveals- No Murmurs.  Abdomen Inspection:-Inspeection  Normal. Palpation/Percussion:Note:No mass. Palpation and Percussion of the abdomen reveal- Non Tender, Non Distended + BS, no rebound or guarding.   Neurologic Cranial Nerve exam:- CN III-XII intact(No nystagmus), symmetric smile. Strength:- 5/5 equal and symmetric strength both upper and lower extremities.      Assessment & Plan:  For you wellness exam today I have ordered cbc, cmp, lipid panel,and hiv.  Vaccine given today tdap.  Recommend exercise and healthy diet.  We will let you know lab results as they come in.  Follow up date appointment will be determined after lab review.   Place gi referral for screening colonoscopy.  Mackie Pai, PA-C

## 2018-12-02 ENCOUNTER — Telehealth: Payer: Self-pay

## 2018-12-02 NOTE — Telephone Encounter (Signed)
Copied from [place] [id number]. Topic: Referral - Question >> Dec 01, 2018 12:28 PM Virl Axe D wrote: Reason for CRM: Pt declined to have colonoscopy done in [place] and would like to have it done somewhere in [place]. Requesting new referral for an [place] location. Please advise.  New referral entered.

## 2018-12-03 ENCOUNTER — Other Ambulatory Visit: Payer: BLUE CROSS/BLUE SHIELD

## 2018-12-03 NOTE — Telephone Encounter (Signed)
A new referral will have to be put in, because the referral was closed

## 2018-12-06 ENCOUNTER — Other Ambulatory Visit: Payer: Self-pay

## 2018-12-06 DIAGNOSIS — Z1211 Encounter for screening for malignant neoplasm of colon: Secondary | ICD-10-CM

## 2018-12-08 NOTE — Telephone Encounter (Signed)
Referral sent to [facility]

## 2019-03-29 ENCOUNTER — Ambulatory Visit: Payer: Self-pay

## 2019-03-29 ENCOUNTER — Encounter: Payer: Self-pay | Admitting: Family Medicine

## 2019-03-29 ENCOUNTER — Telehealth: Payer: Self-pay

## 2019-03-29 ENCOUNTER — Ambulatory Visit (INDEPENDENT_AMBULATORY_CARE_PROVIDER_SITE_OTHER): Payer: BC Managed Care – PPO | Admitting: Family Medicine

## 2019-03-29 ENCOUNTER — Telehealth: Payer: Self-pay | Admitting: Family Medicine

## 2019-03-29 DIAGNOSIS — Z7189 Other specified counseling: Secondary | ICD-10-CM | POA: Diagnosis not present

## 2019-03-29 DIAGNOSIS — Z20822 Contact with and (suspected) exposure to covid-19: Secondary | ICD-10-CM

## 2019-03-29 NOTE — Telephone Encounter (Signed)
Patient called, left VM to return the call to [phone] between the hours 0700-1900 Monday through Friday to schedule testing.

## 2019-03-29 NOTE — Progress Notes (Signed)
No chief complaint on file.   Noralyn Pick here for URI complaints. Due to COVID-19 pandemic, we are interacting via web portal for an electronic face-to-face visit. I verified patient's ID using 2 identifiers. Patient agreed to proceed with visit via this method. Patient is at home, I am at office. Patient and I are present for visit.   Duration: 1 d  Associated symptoms: Fever (99.5 F), myalgia and fatigue Denies: sinus congestion, sinus pain, rhinorrhea, itchy watery eyes, ear pain, ear drainage, sore throat, wheezing, shortness of breath and cough Treatment to date: none Sick contacts: No  ROS:  Const: + fevers HEENT: As noted in HPI Lungs: No SOB  Past Medical History:  Diagnosis Date  . Hyperlipidemia   . Kidney stones    Exam No conversational dyspnea Age appropriate judgment and insight Nml affect and mood  Educated About Covid-19 Virus Infection - Plan: will test. Avoid contact w other people when able. Wear mask when interacting with others. Clean everything thoroughly. Went over worrisome things in which to seek emergent care for.   Pt voiced understanding and agreement to the plan.  [place], DO 03/29/19 2:05 PM

## 2019-03-29 NOTE — Addendum Note (Signed)
Addended by: Dimple Nanas on: 03/29/2019 06:34 PM   Modules accepted: Orders

## 2019-03-29 NOTE — Telephone Encounter (Signed)
Pt called and scheduled for testing on 03/30/19 at [facility] site. Pt advised to wear a mask and remain in car at scheduled appt time. Pt verbalized understanding.

## 2019-03-29 NOTE — Telephone Encounter (Signed)
Pt scheduled for testing on 03/30/19 at [facility] site.

## 2019-03-29 NOTE — Telephone Encounter (Signed)
See Covid Testing encounter. 

## 2019-03-29 NOTE — Telephone Encounter (Signed)
Attempted to call pt. To schedule for COVID testing.  Left vm. To return call to [phone] to schedule appt.

## 2019-03-29 NOTE — Telephone Encounter (Signed)
Virtual visit today 

## 2019-03-29 NOTE — Telephone Encounter (Signed)
Incoming call from Pt.  Reporting that he was feeling tired Yesterday.  Sx started yesterday.  Highest temp 99.4 Denies Sob.  SB   Steven Pick "[name]"   Male, 53 y.o., 1966/09/27 MRN:  [id number] Phone:  [phone] Jerilynn Mages) PCP:  Mackie Pai, PA-C Coverage:  Sherre Poot Blue [employer] Other Message from [place], [place] sent at 03/29/2019 10:14 AM EDT  Summary: possible covid   Patient is calling in stating he is having a slight fever as well as some fatigue. States he feels it may be COVID. Also would like to make known he lives with someone who will be having surgery next week so he wants to make sure he is not positive for COVID. Please advise and call back.         Call History   Type Contact  03/29/2019 10:12 AM EDT Phone (Incoming) Steven Pick "[name]" (Self)  Phone: [phone] (H)  User: Richardo Priest, NT    Reason for Disposition . [1] [id number] diagnosed by HCP (doctor, NP or PA) AND [2] mild symptoms (e.g., cough, fever, others) AND [9] no complications or SOB  Answer Assessment - Initial Assessment Questions 1. COVID-19 DIAGNOSIS: "Who made your Coronavirus (COVID-19) diagnosis?" "Was it confirmed by a positive lab test?" If not diagnosed by a HCP, ask "Are there lots of cases (community spread) where you live?" (See public health department website, if unsure)     2. ONSET: "When did the COVID-19 symptoms start?"     Yesterday  3. WORST SYMPTOM: "What is your worst symptom?" (e.g., cough, fever, shortness of breath, muscle aches)     *99.4,  Headache, tired 4. COUGH: "Do you have a cough?" If so, ask: "How bad is the cough?"       denies 5. FEVER: "Do you have a fever?" If so, ask: "What is your temperature, how was it measured, and when did it start?"     99.4 6. RESPIRATORY STATUS: "Describe your breathing?" (e.g., shortness of breath, wheezing, unable to speak)      Denies SOB 7. BETTER-SAME-WORSE: "Are you getting better, staying the same or  getting worse compared to yesterday?"  If getting worse, ask, "In what way?"    Same 8. HIGH RISK DISEASE: "Do you have any chronic medical problems?" (e.g., asthma, heart or lung disease, weak immune system, etc.)     denies9. PREGNANCY: "Is there any chance you are pregnant?" "When was your last menstrual period?"     denies 10. OTHER SYMPTOMS: "Do you have any other symptoms?"  (e.g., chills, fatigue, headache, loss of smell or taste, muscle pain, sore throat)       Fatigue.  Protocols used: CORONAVIRUS (COVID-19) DIAGNOSED OR SUSPECTED-A-AH

## 2019-03-30 ENCOUNTER — Other Ambulatory Visit: Payer: BC Managed Care – PPO

## 2019-03-30 DIAGNOSIS — Z20822 Contact with and (suspected) exposure to covid-19: Secondary | ICD-10-CM

## 2019-03-30 DIAGNOSIS — R6889 Other general symptoms and signs: Secondary | ICD-10-CM | POA: Diagnosis not present

## 2019-04-03 LAB — NOVEL CORONAVIRUS, NAA: SARS-CoV-2, NAA: NOT DETECTED

## 2019-04-07 ENCOUNTER — Other Ambulatory Visit: Payer: BLUE CROSS/BLUE SHIELD

## 2019-04-14 ENCOUNTER — Other Ambulatory Visit (INDEPENDENT_AMBULATORY_CARE_PROVIDER_SITE_OTHER): Payer: BC Managed Care – PPO

## 2019-04-14 ENCOUNTER — Other Ambulatory Visit: Payer: Self-pay

## 2019-04-14 DIAGNOSIS — Z0000 Encounter for general adult medical examination without abnormal findings: Secondary | ICD-10-CM | POA: Diagnosis not present

## 2019-04-14 DIAGNOSIS — Z113 Encounter for screening for infections with a predominantly sexual mode of transmission: Secondary | ICD-10-CM

## 2019-04-14 DIAGNOSIS — Z125 Encounter for screening for malignant neoplasm of prostate: Secondary | ICD-10-CM | POA: Diagnosis not present

## 2019-04-14 LAB — COMPREHENSIVE METABOLIC PANEL
ALT: 16 U/L (ref 0–53)
AST: 14 U/L (ref 0–37)
Albumin: 3.8 g/dL (ref 3.5–5.2)
Alkaline Phosphatase: 73 U/L (ref 39–117)
BUN: 15 mg/dL (ref 6–23)
CO2: 29 mEq/L (ref 19–32)
Calcium: 8.6 mg/dL (ref 8.4–10.5)
Chloride: 107 mEq/L (ref 96–112)
Creatinine, Ser: 1.03 mg/dL (ref 0.40–1.50)
GFR: 75.94 mL/min (ref >60.00)
Glucose, Bld: 110 mg/dL — ABNORMAL HIGH (ref 70–99)
Potassium: 4.8 mEq/L (ref 3.5–5.1)
Sodium: 142 mEq/L (ref 135–145)
Total Bilirubin: 0.3 mg/dL (ref 0.2–1.2)
Total Protein: 6.4 g/dL (ref 6.0–8.3)

## 2019-04-14 LAB — PSA: PSA: 1.02 ng/mL (ref 0.10–4.00)

## 2019-04-14 LAB — CBC WITH DIFFERENTIAL/PLATELET
Basophils Absolute: 0 10*3/uL (ref 0.0–0.1)
Basophils Relative: 0.8 % (ref 0.0–3.0)
Eosinophils Absolute: 0.1 10*3/uL (ref 0.0–0.7)
Eosinophils Relative: 2.6 % (ref 0.0–5.0)
HCT: 41.1 % (ref 39.0–52.0)
Hemoglobin: 14.1 g/dL (ref 13.0–17.0)
Lymphocytes Relative: 34.9 % (ref 12.0–46.0)
Lymphs Abs: 1.4 10*3/uL (ref 0.7–4.0)
MCHC: 34.4 g/dL (ref 30.0–36.0)
MCV: 92.6 fl (ref 78.0–100.0)
Monocytes Absolute: 0.4 10*3/uL (ref 0.1–1.0)
Monocytes Relative: 10.7 % (ref 3.0–12.0)
Neutro Abs: 2.1 10*3/uL (ref 1.4–7.7)
Neutrophils Relative %: 51 % (ref 43.0–77.0)
Platelets: 304 10*3/uL (ref 150.0–400.0)
RBC: 4.44 Mil/uL (ref 4.22–5.81)
RDW: 12.8 % (ref 11.5–15.5)
WBC: 4 10*3/uL (ref 4.0–10.5)

## 2019-04-14 LAB — LIPID PANEL
Cholesterol: 147 mg/dL (ref 0–200)
HDL: 34.5 mg/dL — ABNORMAL LOW (ref >39.00)
LDL Cholesterol: 100 mg/dL — ABNORMAL HIGH (ref 0–99)
NonHDL: 112.37
Total CHOL/HDL Ratio: 4
Triglycerides: 62 mg/dL (ref 0.0–149.0)
VLDL: 12.4 mg/dL (ref 0.0–40.0)

## 2019-04-15 ENCOUNTER — Other Ambulatory Visit (INDEPENDENT_AMBULATORY_CARE_PROVIDER_SITE_OTHER): Payer: BC Managed Care – PPO

## 2019-04-15 DIAGNOSIS — R739 Hyperglycemia, unspecified: Secondary | ICD-10-CM

## 2019-04-15 LAB — HEMOGLOBIN A1C: Hgb A1c MFr Bld: 6.2 % (ref 4.6–6.5)

## 2019-04-15 LAB — HIV ANTIBODY (ROUTINE TESTING W REFLEX): HIV 1&2 Ab, 4th Generation: NONREACTIVE

## 2019-10-24 DIAGNOSIS — H2512 Age-related nuclear cataract, left eye: Secondary | ICD-10-CM | POA: Diagnosis not present

## 2019-10-24 DIAGNOSIS — H2513 Age-related nuclear cataract, bilateral: Secondary | ICD-10-CM | POA: Diagnosis not present

## 2019-12-25 HISTORY — PX: REMOVE AND REPLACE LENS: SHX6308

## 2019-12-29 DIAGNOSIS — H2512 Age-related nuclear cataract, left eye: Secondary | ICD-10-CM | POA: Diagnosis not present

## 2019-12-29 DIAGNOSIS — H52202 Unspecified astigmatism, left eye: Secondary | ICD-10-CM | POA: Diagnosis not present

## 2019-12-30 DIAGNOSIS — H2511 Age-related nuclear cataract, right eye: Secondary | ICD-10-CM | POA: Diagnosis not present

## 2020-01-19 DIAGNOSIS — H2511 Age-related nuclear cataract, right eye: Secondary | ICD-10-CM | POA: Diagnosis not present

## 2020-01-19 HISTORY — PX: REMOVE AND REPLACE LENS: SHX6308

## 2020-01-25 ENCOUNTER — Encounter: Payer: Self-pay | Admitting: Medical

## 2020-01-25 ENCOUNTER — Ambulatory Visit (INDEPENDENT_AMBULATORY_CARE_PROVIDER_SITE_OTHER): Payer: BC Managed Care – PPO | Admitting: Medical

## 2020-01-25 ENCOUNTER — Other Ambulatory Visit: Payer: Self-pay

## 2020-01-25 VITALS — BP 101/83 | HR 72 | Temp 98.4°F | Resp 12 | Ht 75.5 in | Wt 199.6 lb

## 2020-01-25 DIAGNOSIS — L723 Sebaceous cyst: Secondary | ICD-10-CM

## 2020-01-25 DIAGNOSIS — Z0000 Encounter for general adult medical examination without abnormal findings: Secondary | ICD-10-CM | POA: Diagnosis not present

## 2020-01-25 DIAGNOSIS — Z125 Encounter for screening for malignant neoplasm of prostate: Secondary | ICD-10-CM | POA: Diagnosis not present

## 2020-01-25 DIAGNOSIS — Z1283 Encounter for screening for malignant neoplasm of skin: Secondary | ICD-10-CM

## 2020-01-25 DIAGNOSIS — R739 Hyperglycemia, unspecified: Secondary | ICD-10-CM

## 2020-01-25 NOTE — Progress Notes (Signed)
Subjective:    Patient ID: Steven Raymond, male    DOB: 12/09/1965, 54 y.o.   MRN: [id number]  HPI  Pt in for wellness exam/cpe.  He is fasting. He has not exercising and plans to start eating better. More fruits and vegetables. Pt got motivated after a1c mild elevated. Pt has kept weight off since last wellness. Purposeful weight loss. Feels better.  He has been running on treadmill 5 days a week. Before cataract surgery.  Updates me he just got cataract surgery.   March 2020 put in referral to GI MD.   Pt has upcoming colonoscopy in June or July.   Review of Systems  Constitutional: Negative for chills, fatigue and fever.  Respiratory: Negative for cough, chest tightness, shortness of breath and wheezing.   Cardiovascular: Negative for chest pain and palpitations.  Gastrointestinal: Negative for abdominal pain, nausea and vomiting.  Genitourinary: Negative for decreased urine volume, dysuria, flank pain, frequency, hematuria and testicular pain.  Musculoskeletal: Negative for back pain, myalgias and neck stiffness.  Skin: Negative for rash.  Neurological: Negative for dizziness and light-headedness.  Hematological: Negative for adenopathy. Does not bruise/bleed easily.  Psychiatric/Behavioral: Negative for behavioral problems and decreased concentration. The patient is not nervous/anxious.    Past Medical History:  Diagnosis Date  . Hyperlipidemia   . Kidney stones      Social History   Socioeconomic History  . Marital status: Single    Spouse name: Not on file  . Number of children: Not on file  . Years of education: Not on file  . Highest education level: Not on file  Occupational History  . Occupation: Patent examiner.  Tobacco Use  . Smoking status: Former Research scientist (life sciences)  . Smokeless tobacco: Never Used  Substance and Sexual Activity  . Alcohol use: Yes    Alcohol/week: 0.0 standard drinks  . Drug use: Yes    Types: Marijuana  . Sexual activity: Yes  Other  Topics Concern  . Not on file  Social History Narrative  . Not on file   Social Determinants of Health   Financial Resource Strain:   . Difficulty of Paying Living Expenses:   Food Insecurity:   . Worried About Charity fundraiser in the Last Year:   . Arboriculturist in the Last Year:   Transportation Needs:   . Film/video editor (Medical):   Marland Kitchen Lack of Transportation (Non-Medical):   Physical Activity:   . Days of Exercise per Week:   . Minutes of Exercise per Session:   Stress:   . Feeling of Stress :   Social Connections:   . Frequency of Communication with Friends and Family:   . Frequency of Social Gatherings with Friends and Family:   . Attends Religious Services:   . Active Member of Clubs or Organizations:   . Attends Archivist Meetings:   Marland Kitchen Marital Status:   Intimate Partner Violence:   . Fear of Current or Ex-Partner:   . Emotionally Abused:   Marland Kitchen Physically Abused:   . Sexually Abused:     Past Surgical History:  Procedure Laterality Date  . REMOVE AND REPLACE LENS Right 01/19/2020  . REMOVE AND REPLACE LENS Left 12/25/2019    History reviewed. No pertinent family history.  No Known Allergies  Current Outpatient Medications on File Prior to Visit  Medication Sig Dispense Refill  . BESIVANCE 0.6 % SUSP Place 1 drop into the left eye 3 (three) times daily.    Marland Kitchen  DUREZOL 0.05 % EMUL Place 1 drop into the left eye 3 (three) times daily.    Marland Kitchen PROLENSA 0.07 % SOLN Place 1 drop into the left eye every evening.    . Sodium Chloride, Hypertonic, (MURO 128 OP) Apply to eye.     No current facility-administered medications on file prior to visit.    BP 101/83 (BP Location: Right Arm, Cuff Size: Normal)   Pulse 72   Temp 98.4 F (36.9 C) (Temporal)   Resp 12   Ht 6' 3.5" (1.918 m)   Wt 199 lb 9.6 oz (90.5 kg)   SpO2 99%   BMI 24.62 kg/m       Objective:   Physical Exam  General Mental Status- Alert. General Appearance- Not in acute  distress.   Skin General: Color- Normal Color. Moisture- Normal Moisture.  Neck Carotid Arteries- Normal color. Moisture- Normal Moisture. No carotid bruits. No JVD.  Chest and Lung Exam Auscultation: Breath Sounds:-Normal.  Cardiovascular Auscultation:Rythm- Regular. Murmurs & Other Heart Sounds:Auscultation of the heart reveals- No Murmurs.  Abdomen Inspection:-Inspeection Normal. Palpation/Percussion:Note:No mass. Palpation and Percussion of the abdomen reveal- Non Tender, Non Distended + BS, no rebound or guarding.   Neurologic Cranial Nerve exam:- CN III-XII intact(No nystagmus), symmetric smile. Strength:- 5/5 equal and symmetric strength both upper and lower extremities.  Derm-  Small scattered moles. Mid thoracic. Small sebacious cyst.  Rectal- pt declined exam.      Assessment & Plan:  For you wellness exam today I have ordered cbc, cmp, lipid panel, a1c and psa. Vaccine given today.   Recommend exercise and healthy diet.(continue current diet and exercise)  We will let you know lab results as they come in.  Follow up date appointment will be determined after lab review.  Mackie Pai, PA-C

## 2020-01-25 NOTE — Progress Notes (Signed)
3

## 2020-01-25 NOTE — Patient Instructions (Addendum)
For you wellness exam today I have ordered cbc, cmp, lipid panel, a1c and psa. Vaccine given today.   Recommend exercise and healthy diet.(continue current diet and exercise)  We will let you know lab results as they come in.  Follow up date appointment will be determined after lab review.   Derm referral placed.   Get colonscopy in summer as scheduled.   Preventive Care 68-53 Years Old, Male Preventive care refers to lifestyle choices and visits with your health care provider that can promote health and wellness. This includes:  A yearly physical exam. This is also called an annual well check.  Regular dental and eye exams.  Immunizations.  Screening for certain conditions.  Healthy lifestyle choices, such as eating a healthy diet, getting regular exercise, not using drugs or products that contain nicotine and tobacco, and limiting alcohol use. What can I expect for my preventive care visit? Physical exam Your health care provider will check:  Height and weight. These may be used to calculate body mass index (BMI), which is a measurement that tells if you are at a healthy weight.  Heart rate and blood pressure.  Your skin for abnormal spots. Counseling Your health care provider may ask you questions about:  Alcohol, tobacco, and drug use.  Emotional well-being.  Home and relationship well-being.  Sexual activity.  Eating habits.  Work and work Statistician. What immunizations do I need?  Influenza (flu) vaccine  This is recommended every year. Tetanus, diphtheria, and pertussis (Tdap) vaccine  You may need a Td booster every 10 years. Varicella (chickenpox) vaccine  You may need this vaccine if you have not already been vaccinated. Zoster (shingles) vaccine  You may need this after age 96. Measles, mumps, and rubella (MMR) vaccine  You may need at least one dose of MMR if you were born in 1957 or later. You may also need a second dose. Pneumococcal  conjugate (PCV13) vaccine  You may need this if you have certain conditions and were not previously vaccinated. Pneumococcal polysaccharide (PPSV23) vaccine  You may need one or two doses if you smoke cigarettes or if you have certain conditions. Meningococcal conjugate (MenACWY) vaccine  You may need this if you have certain conditions. Hepatitis A vaccine  You may need this if you have certain conditions or if you travel or work in places where you may be exposed to hepatitis A. Hepatitis B vaccine  You may need this if you have certain conditions or if you travel or work in places where you may be exposed to hepatitis B. Haemophilus influenzae type b (Hib) vaccine  You may need this if you have certain risk factors. Human papillomavirus (HPV) vaccine  If recommended by your health care provider, you may need three doses over 6 months. You may receive vaccines as individual doses or as more than one vaccine together in one shot (combination vaccines). Talk with your health care provider about the risks and benefits of combination vaccines. What tests do I need? Blood tests  Lipid and cholesterol levels. These may be checked every 5 years, or more frequently if you are over 54 years old.  Hepatitis C test.  Hepatitis B test. Screening  Lung cancer screening. You may have this screening every year starting at age 46 if you have a 30-pack-year history of smoking and currently smoke or have quit within the past 15 years.  Prostate cancer screening. Recommendations will vary depending on your family history and other risks.  Colorectal  cancer screening. All adults should have this screening starting at age 26 and continuing until age 76. Your health care provider may recommend screening at age 20 if you are at increased risk. You will have tests every 1-10 years, depending on your results and the type of screening test.  Diabetes screening. This is done by checking your blood sugar  (glucose) after you have not eaten for a while (fasting). You may have this done every 1-3 years.  Sexually transmitted disease (STD) testing. Follow these instructions at home: Eating and drinking  Eat a diet that includes fresh fruits and vegetables, whole grains, lean protein, and low-fat dairy products.  Take vitamin and mineral supplements as recommended by your health care provider.  Do not drink alcohol if your health care provider tells you not to drink.  If you drink alcohol: ? Limit how much you have to 0-2 drinks a day. ? Be aware of how much alcohol is in your drink. In the [place] one drink equals one 12 oz bottle of beer (355 mL), one 5 oz glass of wine (148 mL), or one 1 oz glass of hard liquor (44 mL). Lifestyle  Take daily care of your teeth and gums.  Stay active. Exercise for at least 30 minutes on 5 or more days each week.  Do not use any products that contain nicotine or tobacco, such as cigarettes, e-cigarettes, and chewing tobacco. If you need help quitting, ask your health care provider.  If you are sexually active, practice safe sex. Use a condom or other form of protection to prevent STIs (sexually transmitted infections).  Talk with your health care provider about taking a low-dose aspirin every day starting at age 62. What's next?  Go to your health care provider once a year for a well check visit.  Ask your health care provider how often you should have your eyes and teeth checked.  Stay up to date on all vaccines. This information is not intended to replace advice given to you by your health care provider. Make sure you discuss any questions you have with your health care provider. Document Revised: 09/16/2018 Document Reviewed: 09/16/2018 Elsevier Patient Education  2020 [employer].

## 2020-01-26 LAB — COMPREHENSIVE METABOLIC PANEL
ALT: 20 U/L (ref 0–53)
AST: 18 U/L (ref 0–37)
Albumin: 4.2 g/dL (ref 3.5–5.2)
Alkaline Phosphatase: 57 U/L (ref 39–117)
BUN: 15 mg/dL (ref 6–23)
CO2: 31 mEq/L (ref 19–32)
Calcium: 9.4 mg/dL (ref 8.4–10.5)
Chloride: 102 mEq/L (ref 96–112)
Creatinine, Ser: 1.04 mg/dL (ref 0.40–1.50)
GFR: 74.87 mL/min (ref >60.00)
Glucose, Bld: 96 mg/dL (ref 70–99)
Potassium: 4.7 mEq/L (ref 3.5–5.1)
Sodium: 139 mEq/L (ref 135–145)
Total Bilirubin: 0.4 mg/dL (ref 0.2–1.2)
Total Protein: 6.8 g/dL (ref 6.0–8.3)

## 2020-01-26 LAB — HEMOGLOBIN A1C: Hgb A1c MFr Bld: 6 % (ref 4.6–6.5)

## 2020-01-26 LAB — LIPID PANEL
Cholesterol: 189 mg/dL (ref 0–200)
HDL: 46.6 mg/dL (ref >39.00)
LDL Cholesterol: 127 mg/dL — ABNORMAL HIGH (ref 0–99)
NonHDL: 142.15
Total CHOL/HDL Ratio: 4
Triglycerides: 78 mg/dL (ref 0.0–149.0)
VLDL: 15.6 mg/dL (ref 0.0–40.0)

## 2020-01-26 LAB — CBC WITH DIFFERENTIAL/PLATELET
Basophils Absolute: 0.1 10*3/uL (ref 0.0–0.1)
Basophils Relative: 1 % (ref 0.0–3.0)
Eosinophils Absolute: 0.1 10*3/uL (ref 0.0–0.7)
Eosinophils Relative: 2 % (ref 0.0–5.0)
HCT: 42.8 % (ref 39.0–52.0)
Hemoglobin: 14.4 g/dL (ref 13.0–17.0)
Lymphocytes Relative: 36.8 % (ref 12.0–46.0)
Lymphs Abs: 2.3 10*3/uL (ref 0.7–4.0)
MCHC: 33.8 g/dL (ref 30.0–36.0)
MCV: 93.3 fl (ref 78.0–100.0)
Monocytes Absolute: 0.5 10*3/uL (ref 0.1–1.0)
Monocytes Relative: 7.4 % (ref 3.0–12.0)
Neutro Abs: 3.3 10*3/uL (ref 1.4–7.7)
Neutrophils Relative %: 52.8 % (ref 43.0–77.0)
Platelets: 307 10*3/uL (ref 150.0–400.0)
RBC: 4.58 Mil/uL (ref 4.22–5.81)
RDW: 13.2 % (ref 11.5–15.5)
WBC: 6.3 10*3/uL (ref 4.0–10.5)

## 2020-01-26 LAB — PSA: PSA: 1.31 ng/mL (ref 0.10–4.00)

## 2020-02-14 DIAGNOSIS — L720 Epidermal cyst: Secondary | ICD-10-CM | POA: Diagnosis not present

## 2020-02-14 DIAGNOSIS — L821 Other seborrheic keratosis: Secondary | ICD-10-CM | POA: Diagnosis not present

## 2020-02-14 DIAGNOSIS — X32XXXS Exposure to sunlight, sequela: Secondary | ICD-10-CM | POA: Diagnosis not present

## 2020-02-14 DIAGNOSIS — L814 Other melanin hyperpigmentation: Secondary | ICD-10-CM | POA: Diagnosis not present

## 2020-04-27 DIAGNOSIS — Z1211 Encounter for screening for malignant neoplasm of colon: Secondary | ICD-10-CM | POA: Diagnosis not present

## 2020-04-27 DIAGNOSIS — K649 Unspecified hemorrhoids: Secondary | ICD-10-CM | POA: Diagnosis not present

## 2020-04-27 DIAGNOSIS — K640 First degree hemorrhoids: Secondary | ICD-10-CM | POA: Diagnosis not present

## 2020-07-09 ENCOUNTER — Ambulatory Visit (INDEPENDENT_AMBULATORY_CARE_PROVIDER_SITE_OTHER): Payer: BC Managed Care – PPO | Admitting: Family Medicine

## 2020-07-09 ENCOUNTER — Encounter: Payer: Self-pay | Admitting: Family Medicine

## 2020-07-09 ENCOUNTER — Ambulatory Visit: Payer: BC Managed Care – PPO | Admitting: Family Medicine

## 2020-07-09 ENCOUNTER — Other Ambulatory Visit: Payer: Self-pay

## 2020-07-09 VITALS — BP 122/68 | HR 81 | Temp 98.2°F | Ht 75.5 in | Wt 200.0 lb

## 2020-07-09 DIAGNOSIS — R0789 Other chest pain: Secondary | ICD-10-CM | POA: Diagnosis not present

## 2020-07-09 DIAGNOSIS — Z23 Encounter for immunization: Secondary | ICD-10-CM | POA: Diagnosis not present

## 2020-07-09 NOTE — Patient Instructions (Signed)
Heat (pad or rice pillow in microwave) over affected area, 10-15 minutes twice daily.   Ice/cold pack over area for 10-15 min twice daily.  OK to take Tylenol 1000 mg (2 extra strength tabs) or 975 mg (3 regular strength tabs) every 6 hours as needed.  Ibuprofen 400-600 mg (2-3 over the counter strength tabs) every 6 hours as needed for pain.  I think this is muscles/bones- related.  Send me a message if we aren't better. We can get you set up with sports medicine team or physical therapy.   I think side bends are very important for your stretching. Do both sides, 30 seconds and repeat 2 times per side as with the rest of the stretches below.   Mid-Back Strain Rehab It is normal to feel mild stretching, pulling, tightness, or discomfort as you do these exercises, but you should stop right away if you feel sudden pain or your pain gets worse.  Stretching and range of motion exercises This exercise warms up your muscles and joints and improves the movement and flexibility of your back and shoulders. This exercise also help to relieve pain. Exercise A: Chest and spine stretch  1. Lie down on your back on a firm surface. 2. Roll a towel or a small blanket so it is about 4 inches (10 cm) in diameter. 3. Put the towel lengthwise under the middle of your back so it is under your spine, but not under your shoulder blades. 4. To increase the stretch, you may put your hands behind your head and let your elbows fall to your sides. 5. Hold for 30 seconds. Repeat exercise 2 times. Complete this exercise 3 times per week. Strengthening exercises These exercises build strength and endurance in your back and your shoulder blade muscles. Endurance is the ability to use your muscles for a long time, even after they get tired. Exercise C: Straight arm rows (shoulder extension) 1. Stand with your feet shoulder width apart. 2. Secure an exercise band to a stable object in front of you so the band is at or  above shoulder height. 3. Hold one end of the exercise band in each hand. 4. Straighten your elbows and lift your hands up to shoulder height. 5. Step back, away from the secured end of the exercise band, until the band stretches. 6. Squeeze your shoulder blades together and pull your hands down to the sides of your thighs. Stop when your hands are straight down by your sides. Do not let your hands go behind your body. 7. Hold for 2 seconds. 8. Slowly return to the starting position. Repeat 2 times. Complete this exercise 3 times per week. Exercise D: Shoulder external rotation, prone 1. Lie on your abdomen on a firm bed so your left / right forearm hangs over the edge of the bed and your upper arm is on the bed, straight out from your body. ? Your elbow should be bent. ? Your palm should be facing your feet. 2. If instructed, hold a 2-5 lb weight in your hand. 3. Squeeze your shoulder blade toward the middle of your back. Do not let your shoulder lift toward your ear. 4. Keep your elbow bent in an "L" shape (90 degrees) while you slowly move your forearm up toward the ceiling. Move your forearm up to the height of the bed, toward your head. ? Your upper arm should not move. ? At the top of the movement, your palm should face the floor. 5. Hold  for 1 second. 6. Slowly return to the starting position and relax your muscles. Repeat 2 times. Complete this exercise 3 times per week. Exercise E: Scapular retraction and external rotation, rowing  1. Sit in a stable chair without armrests, or stand. 2. Secure an exercise band to a stable object in front of you so it is at shoulder height. 3. Hold one end of the exercise band in each hand. 4. Bring your arms out straight in front of you. 5. Step back, away from the secured end of the exercise band, until the band stretches. 6. Pull the band backward. As you do this, bend your elbows and squeeze your shoulder blades together, but avoid letting the  rest of your body move. Do not let your shoulders lift up toward your ears. 7. Stop when your elbows are at your sides or slightly behind your body. 8. Hold for 1 second1. 9. Slowly straighten your arms to return to the starting position. Repeat 2 times. Complete this exercise 3 times per week. Posture and body mechanics  Body mechanics refers to the movements and positions of your body while you do your daily activities. Posture is part of body mechanics. Good posture and healthy body mechanics can help to relieve stress in your body's tissues and joints. Good posture means that your spine is in its natural S-curve position (your spine is neutral), your shoulders are pulled back slightly, and your head is not tipped forward. The following are general guidelines for applying improved posture and body mechanics to your everyday activities. Standing   When standing, keep your spine neutral and your feet about hip-width apart. Keep a slight bend in your knees. Your ears, shoulders, and hips should line up.  When you do a task in which you lean forward while standing in one place for a long time, place one foot up on a stable object that is 2-4 inches (5-10 cm) high, such as a footstool. This helps keep your spine neutral. Sitting   When sitting, keep your spine neutral and keep your feet flat on the floor. Use a footrest, if necessary, and keep your thighs parallel to the floor. Avoid rounding your shoulders, and avoid tilting your head forward.  When working at a desk or a computer, keep your desk at a height where your hands are slightly lower than your elbows. Slide your chair under your desk so you are close enough to maintain good posture.  When working at a computer, place your monitor at a height where you are looking straight ahead and you do not have to tilt your head forward or downward to look at the screen. Resting  When lying down and resting, avoid positions that are most painful  for you.  If you have pain with activities such as sitting, bending, stooping, or squatting (flexion-based activities), lie in a position in which your body does not bend very much. For example, avoid curling up on your side with your arms and knees near your chest (fetal position).  If you have pain with activities such as standing for a long time or reaching with your arms (extension-based activities), lie with your spine in a neutral position and bend your knees slightly. Try the following positions:  Lying on your side with a pillow between your knees.  Lying on your back with a pillow under your knees.  Lifting   When lifting objects, keep your feet at least shoulder-width apart and tighten your abdominal muscles.  [employer]  your knees and hips and keep your spine neutral. It is important to lift using the strength of your legs, not your back. Do not lock your knees straight out.  Always ask for help to lift heavy or awkward objects. Make sure you discuss any questions you have with your health care provider. Document Released: 09/22/2005 Document Revised: 05/29/2016 Document Reviewed: 07/04/2015 Elsevier Interactive Patient Education  [employer].

## 2020-07-09 NOTE — Progress Notes (Signed)
Musculoskeletal Exam  Patient: Steven Raymond DOB: May 20, 1966  DOS: 07/09/2020  SUBJECTIVE:  Chief Complaint:   Chief Complaint  Patient presents with  . injury with a crow bar    Steven Raymond is a 54 y.o.  male for evaluation and treatment of L side pain.   Onset:  6 days ago had pain overall R side. Had an injury with a crow bar 9 d ago Location: R lateral abd wall US done in 2015 that was normal.  Character:  sharp  Progression of issue:  is unchanged Associated symptoms: some bruising higher up and some swelling; no redness or bowel changes  No a/w meals. Just had a colonoscopy that was normal, 10 yr f/u rec'd. BM's don't affect pain. Treatment: to date has been OTC NSAIDS and opiates.   Neurovascular symptoms: no  Past Medical History:  Diagnosis Date  . Hyperlipidemia   . Kidney stones     Objective: VITAL SIGNS: BP 122/68 (BP Location: Left Arm, Patient Position: Sitting, Cuff Size: Normal)   Pulse 81   Temp 98.2 F (36.8 C) (Oral)   Ht 6' 3.5" (1.918 m)   Wt 200 lb (90.7 kg)   SpO2 97%   BMI 24.67 kg/m  Constitutional: Well formed, well developed. No acute distress. Thorax & Lungs: No accessory muscle use Musculoskeletal: R side.   Tenderness to palpation: yes over R lower rib cage Deformity: yes Ecchymosis: yes Neurologic: Normal sensory function. Psychiatric: Normal mood. Age appropriate judgment and insight. Alert & oriented x 3.    Assessment:  Chest wall pain  Need for influenza vaccination - Plan: Flu Vaccine QUAD 6+ mos PF IM (Fluarix Quad PF)  Plan: Stretches/exercises, heat, ice, Tylenol, NSAIDs. If no improvement, can refer to PT vs sports med. He will send a message.  F/u prn. The patient voiced understanding and agreement to the plan.  Greater than 26 minutes were spent face to face with the patient and greater than 5 min were spent reviewing his history and prior imaging. Total time: 31 min  [place], [place] 07/09/20   12:07 PM

## 2021-04-16 ENCOUNTER — Encounter: Payer: Self-pay | Admitting: Medical

## 2022-01-15 ENCOUNTER — Ambulatory Visit (INDEPENDENT_AMBULATORY_CARE_PROVIDER_SITE_OTHER): Payer: BC Managed Care – PPO | Admitting: Medical

## 2022-01-15 VITALS — BP 120/86 | HR 54 | Resp 18 | Ht 75.5 in | Wt 210.0 lb

## 2022-01-15 DIAGNOSIS — M2550 Pain in unspecified joint: Secondary | ICD-10-CM

## 2022-01-15 DIAGNOSIS — R739 Hyperglycemia, unspecified: Secondary | ICD-10-CM

## 2022-01-15 DIAGNOSIS — Z0000 Encounter for general adult medical examination without abnormal findings: Secondary | ICD-10-CM | POA: Diagnosis not present

## 2022-01-15 DIAGNOSIS — R0789 Other chest pain: Secondary | ICD-10-CM | POA: Diagnosis not present

## 2022-01-15 DIAGNOSIS — Z125 Encounter for screening for malignant neoplasm of prostate: Secondary | ICD-10-CM

## 2022-01-15 LAB — CBC WITH DIFFERENTIAL/PLATELET
Basophils Absolute: 0 10*3/uL (ref 0.0–0.1)
Basophils Relative: 1 % (ref 0.0–3.0)
Eosinophils Absolute: 0.2 10*3/uL (ref 0.0–0.7)
Eosinophils Relative: 3.8 % (ref 0.0–5.0)
HCT: 45.2 % (ref 39.0–52.0)
Hemoglobin: 15.2 g/dL (ref 13.0–17.0)
Lymphocytes Relative: 36.6 % (ref 12.0–46.0)
Lymphs Abs: 1.6 10*3/uL (ref 0.7–4.0)
MCHC: 33.6 g/dL (ref 30.0–36.0)
MCV: 93.6 fl (ref 78.0–100.0)
Monocytes Absolute: 0.4 10*3/uL (ref 0.1–1.0)
Monocytes Relative: 9.7 % (ref 3.0–12.0)
Neutro Abs: 2.2 10*3/uL (ref 1.4–7.7)
Neutrophils Relative %: 48.9 % (ref 43.0–77.0)
Platelets: 224 10*3/uL (ref 150.0–400.0)
RBC: 4.82 Mil/uL (ref 4.22–5.81)
RDW: 13.1 % (ref 11.5–15.5)
WBC: 4.5 10*3/uL (ref 4.0–10.5)

## 2022-01-15 LAB — COMPREHENSIVE METABOLIC PANEL
ALT: 17 U/L (ref 0–53)
AST: 17 U/L (ref 0–37)
Albumin: 4.5 g/dL (ref 3.5–5.2)
Alkaline Phosphatase: 56 U/L (ref 39–117)
BUN: 23 mg/dL (ref 6–23)
CO2: 30 mEq/L (ref 19–32)
Calcium: 9.5 mg/dL (ref 8.4–10.5)
Chloride: 103 mEq/L (ref 96–112)
Creatinine, Ser: 1 mg/dL (ref 0.40–1.50)
GFR: 85.39 mL/min (ref >60.00)
Glucose, Bld: 101 mg/dL — ABNORMAL HIGH (ref 70–99)
Potassium: 5 mEq/L (ref 3.5–5.1)
Sodium: 138 mEq/L (ref 135–145)
Total Bilirubin: 0.6 mg/dL (ref 0.2–1.2)
Total Protein: 6.8 g/dL (ref 6.0–8.3)

## 2022-01-15 LAB — LIPID PANEL
Cholesterol: 235 mg/dL — ABNORMAL HIGH (ref 0–200)
HDL: 56.2 mg/dL (ref >39.00)
LDL Cholesterol: 161 mg/dL — ABNORMAL HIGH (ref 0–99)
NonHDL: 178.89
Total CHOL/HDL Ratio: 4
Triglycerides: 90 mg/dL (ref 0.0–149.0)
VLDL: 18 mg/dL (ref 0.0–40.0)

## 2022-01-15 LAB — HEMOGLOBIN A1C: Hgb A1c MFr Bld: 6.2 % (ref 4.6–6.5)

## 2022-01-15 LAB — URIC ACID: Uric Acid, Serum: 7.6 mg/dL (ref 4.0–7.8)

## 2022-01-15 LAB — PSA: PSA: 0.98 ng/mL (ref 0.10–4.00)

## 2022-01-15 NOTE — Patient Instructions (Addendum)
For you wellness exam today I have ordered cbc, cmp , psa and lipid panel. ? ?Vaccine given today shingrix. ? ?Recommend exercise and healthy diet. ? ?We will let you know lab results as they come in. ? ?Follow up date appointment will be determined after lab review.   ? ?For atypical random lower pectoralis pain we got ekg today. Ekg shows sinus bradycardia. No skipped beats. Appears normal pr interval. If you have more constant severe type pain then recommend ED evaluation. If pain continue random intermittent let me know and can refer to cardiologist. ? ?Low level random joint pain. Offered inflammatory labs today. Declined initially but agreed to uric acid. ? ?Add a1c to labs due to mild elevated sugar in the past. ? ?Preventive Care 61-73 Years Old, Male ?Preventive care refers to lifestyle choices and visits with your health care provider that can promote health and wellness. Preventive care visits are also called wellness exams. ?What can I expect for my preventive care visit? ?Counseling ?During your preventive care visit, your health care provider may ask about your: ?Medical history, including: ?Past medical problems. ?Family medical history. ?Current health, including: ?Emotional well-being. ?Home life and relationship well-being. ?Sexual activity. ?Lifestyle, including: ?Alcohol, nicotine or tobacco, and drug use. ?Access to firearms. ?Diet, exercise, and sleep habits. ?Safety issues such as seatbelt and bike helmet use. ?Sunscreen use. ?Work and work Statistician. ?Physical exam ?Your health care provider will check your: ?Height and weight. These may be used to calculate your BMI (body mass index). BMI is a measurement that tells if you are at a healthy weight. ?Waist circumference. This measures the distance around your waistline. This measurement also tells if you are at a healthy weight and may help predict your risk of certain diseases, such as type 2 diabetes and high blood pressure. ?Heart rate  and blood pressure. ?Body temperature. ?Skin for abnormal spots. ?What immunizations do I need? ?Vaccines are usually given at various ages, according to a schedule. Your health care provider will recommend vaccines for you based on your age, medical history, and lifestyle or other factors, such as travel or where you work. ?What tests do I need? ?Screening ?Your health care provider may recommend screening tests for certain conditions. This may include: ?Lipid and cholesterol levels. ?Diabetes screening. This is done by checking your blood sugar (glucose) after you have not eaten for a while (fasting). ?Hepatitis B test. ?Hepatitis C test. ?HIV (human immunodeficiency virus) test. ?STI (sexually transmitted infection) testing, if you are at risk. ?Lung cancer screening. ?Prostate cancer screening. ?Colorectal cancer screening. ?Talk with your health care provider about your test results, treatment options, and if necessary, the need for more tests. ?Follow these instructions at home: ?Eating and drinking ? ?Eat a diet that includes fresh fruits and vegetables, whole grains, lean protein, and low-fat dairy products. ?Take vitamin and mineral supplements as recommended by your health care provider. ?Do not drink alcohol if your health care provider tells you not to drink. ?If you drink alcohol: ?Limit how much you have to 0-2 drinks a day. ?Know how much alcohol is in your drink. In the [place] one drink equals one 12 oz bottle of beer (355 mL), one 5 oz glass of wine (148 mL), or one 1? oz glass of hard liquor (44 mL). ?Lifestyle ?Brush your teeth every morning and night with fluoride toothpaste. Floss one time each day. ?Exercise for at least 30 minutes 5 or more days each week. ?Do not use any products  that contain nicotine or tobacco. These products include cigarettes, chewing tobacco, and vaping devices, such as e-cigarettes. If you need help quitting, ask your health care provider. ?Do not use drugs. ?If you are  sexually active, practice safe sex. Use a condom or other form of protection to prevent STIs. ?Take aspirin only as told by your health care provider. Make sure that you understand how much to take and what form to take. Work with your health care provider to find out whether it is safe and beneficial for you to take aspirin daily. ?Find healthy ways to manage stress, such as: ?Meditation, yoga, or listening to music. ?Journaling. ?Talking to a trusted person. ?Spending time with friends and family. ?Minimize exposure to UV radiation to reduce your risk of skin cancer. ?Safety ?Always wear your seat belt while driving or riding in a vehicle. ?Do not drive: ?If you have been drinking alcohol. Do not ride with someone who has been drinking. ?When you are tired or distracted. ?While texting. ?If you have been using any mind-altering substances or drugs. ?Wear a helmet and other protective equipment during sports activities. ?If you have firearms in your house, make sure you follow all gun safety procedures. ?What's next? ?Go to your health care provider once a year for an annual wellness visit. ?Ask your health care provider how often you should have your eyes and teeth checked. ?Stay up to date on all vaccines. ?This information is not intended to replace advice given to you by your health care provider. Make sure you discuss any questions you have with your health care provider. ?Document Revised: 03/20/2021 Document Reviewed: 03/20/2021 ?Elsevier Patient Education ? 2022 [employer]. ? ? ? ? ? ? ? ? ? ? ? ? ? ?

## 2022-01-15 NOTE — Progress Notes (Signed)
? ?Subjective:  ? ? Patient ID: Steven Raymond, male    DOB: [birth year], 56 y.o.   MRN: [id number] ? ?HPI ?Pt in for wellness exam/cpe. ?  ?He is fasting. He have beeexercising and plans to start eating better. More fruits and vegetables. Mild elevated sugar in the past. Purposeful weight loss. Feels better. ?  ?He has been running on treadmill 5 days a week. Before cataract surgery. ?  ?Updates me he just got cataract surgery.  ?  ?March 2020 put in referral to GI MD.  ?  ?Pt has upcoming colonoscopy in June or July. ? ? ?Pt mentions random occasional pain beneath left pectoralis region pain. But also some sporadic occasional other region of chest pain.  ? ?I explained to pt recommend ekg and he declined. ? ?Some sporadic joint pains. Left shoulder pain at times. Told rotator cuff in past. Some bilateral wrist pains at times.  ? ? ? ?Review of Systems  ?Constitutional:  Negative for chills, fatigue and fever.  ?HENT:  Negative for congestion and dental problem.   ?Respiratory:  Negative for cough, choking, shortness of breath and wheezing.   ?Cardiovascular:  Negative for chest pain and palpitations.  ?     Atypical chest pain but not presently.  ?Gastrointestinal:  Negative for abdominal pain, constipation and rectal pain.  ?Genitourinary:  Negative for dysuria, frequency, hematuria and penile pain.  ?Musculoskeletal:  Negative for back pain.  ?Neurological:  Negative for dizziness, seizures, numbness and headaches.  ?Hematological:  Negative for adenopathy. Does not bruise/bleed easily.  ?Psychiatric/Behavioral:  Negative for behavioral problems, confusion and decreased concentration.   ? ? ?Past Medical History:  ?Diagnosis Date  ? Hyperlipidemia   ? Kidney stones   ? ?  ?Social History  ? ?Socioeconomic History  ? Marital status: Single  ?  Spouse name: Not on file  ? Number of children: Not on file  ? Years of education: Not on file  ? Highest education level: Not on file  ?Occupational History  ? Occupation:  Patent examiner.  ?Tobacco Use  ? Smoking status: Former  ? Smokeless tobacco: Never  ?Vaping Use  ? Vaping Use: Never used  ?Substance and Sexual Activity  ? Alcohol use: Yes  ?  Alcohol/week: 0.0 standard drinks  ? Drug use: Yes  ?  Types: Marijuana  ? Sexual activity: Yes  ?Other Topics Concern  ? Not on file  ?Social History Narrative  ? Not on file  ? ?Social Determinants of Health  ? ?Financial Resource Strain: Not on file  ?Food Insecurity: Not on file  ?Transportation Needs: Not on file  ?Physical Activity: Not on file  ?Stress: Not on file  ?Social Connections: Not on file  ?Intimate Partner Violence: Not on file  ? ? ?Past Surgical History:  ?Procedure Laterality Date  ? REMOVE AND REPLACE LENS Right 01/19/2020  ? REMOVE AND REPLACE LENS Left 12/25/2019  ? ? ?No family history on file. ? ?No Known Allergies ? ?No current outpatient medications on file prior to visit.  ? ?No current facility-administered medications on file prior to visit.  ? ? ?BP 120/86   Pulse (!) 54   Resp 18   Ht 6' 3.5" (1.918 m)   Wt 210 lb (95.3 kg)   SpO2 99%   BMI 25.90 kg/m?  ?  ?   ?Objective:  ? Physical Exam ? ? ?General ?Mental Status- Alert. General Appearance- Not in acute distress.  ? ?Skin ?General: Color- Normal Color.  Moisture- Normal Moisture. ? ?Neck ?Carotid Arteries- Normal color. Moisture- Normal Moisture. No carotid bruits. No JVD. ? ?Chest and Lung Exam ?Auscultation: ?Breath Sounds:-Normal. ? ?Cardiovascular ?Auscultation:Rythm- Regular. ?Murmurs & Other Heart Sounds:Auscultation of the heart reveals- No Murmurs. ? ?Abdomen ?Inspection:-Inspeection Normal. ?Palpation/Percussion:Note:No mass. Palpation and Percussion of the abdomen reveal- Non Tender, Non Distended + BS, no rebound or guarding. ? ? ?Neurologic ?Cranial Nerve exam:- CN III-XII intact(No nystagmus), symmetric smile. ?Strength:- 5/5 equal and symmetric strength both upper and lower extremities.  ? ?   ?Assessment & Plan:  ? ?Patient  Instructions  ?For you wellness exam today I have ordered cbc, cmp , psa and lipid panel. ? ?Vaccine given today shingrix. ? ?Recommend exercise and healthy diet. ? ?We will let you know lab results as they come in. ? ?Follow up date appointment will be determined after lab review.   ? ?For atypical random lower pectoralis pain we got ekg today. ? ?Low level random joint pain. Offered inflammatory labs today. Declined initially but agreed to uric acid. ? ?Add a1c to labs due to mild elevated sugar in the past. ? ? ? ? ? ? ? ? ? ? ? ? [phone] charge as did address atypical chest pain and discussed joint pains. ?

## 2023-03-24 ENCOUNTER — Ambulatory Visit (INDEPENDENT_AMBULATORY_CARE_PROVIDER_SITE_OTHER): Payer: BC Managed Care – PPO | Admitting: Medical

## 2023-03-24 VITALS — BP 126/82 | HR 70 | Resp 18 | Ht 75.5 in | Wt 206.0 lb

## 2023-03-24 DIAGNOSIS — Z0000 Encounter for general adult medical examination without abnormal findings: Secondary | ICD-10-CM

## 2023-03-24 DIAGNOSIS — R0789 Other chest pain: Secondary | ICD-10-CM

## 2023-03-24 DIAGNOSIS — R350 Frequency of micturition: Secondary | ICD-10-CM

## 2023-03-24 DIAGNOSIS — Z125 Encounter for screening for malignant neoplasm of prostate: Secondary | ICD-10-CM

## 2023-03-24 NOTE — Patient Instructions (Addendum)
For you wellness exam today I have ordered cbc, cmp and lipid panel.  Shingrix- if you can ask your pharmacy or date or to send [place] the dat  Recommend exercise and healthy diet.  We will let you know lab results as they come in.  Follow up date appointment will be determined after lab review.    For depression phq-9 score 5 gave counselor sheet to review. Recommend that you call behavioral health office to get scheduled. If you need referral for office or insurance let me know.   For frequenet urination get UA and psa.  For very atypical chest pain referred to cardiologist. In event of pain that is constant or persising palpation be seen in ED pending cardilologist appt.   Preventive Care 98-30 Years Old, Male Preventive care refers to lifestyle choices and visits with your health care provider that can promote health and wellness. Preventive care visits are also called wellness exams. What can I expect for my preventive care visit? Counseling During your preventive care visit, your health care provider may ask about your: Medical history, including: Past medical problems. Family medical history. Current health, including: Emotional well-being. Home life and relationship well-being. Sexual activity. Lifestyle, including: Alcohol, nicotine or tobacco, and drug use. Access to firearms. Diet, exercise, and sleep habits. Safety issues such as seatbelt and bike helmet use. Sunscreen use. Work and work Astronomer. Physical exam Your health care provider will check your: Height and weight. These may be used to calculate your BMI (body mass index). BMI is a measurement that tells if you are at a healthy weight. Waist circumference. This measures the distance around your waistline. This measurement also tells if you are at a healthy weight and may help predict your risk of certain diseases, such as type 2 diabetes and high blood pressure. Heart rate and blood pressure. Body  temperature. Skin for abnormal spots. What immunizations do I need?  Vaccines are usually given at various ages, according to a schedule. Your health care provider will recommend vaccines for you based on your age, medical history, and lifestyle or other factors, such as travel or where you work. What tests do I need? Screening Your health care provider may recommend screening tests for certain conditions. This may include: Lipid and cholesterol levels. Diabetes screening. This is done by checking your blood sugar (glucose) after you have not eaten for a while (fasting). Hepatitis B test. Hepatitis C test. HIV (human immunodeficiency virus) test. STI (sexually transmitted infection) testing, if you are at risk. Lung cancer screening. Prostate cancer screening. Colorectal cancer screening. Talk with your health care provider about your test results, treatment options, and if necessary, the need for more tests. Follow these instructions at home: Eating and drinking  Eat a diet that includes fresh fruits and vegetables, whole grains, lean protein, and low-fat dairy products. Take vitamin and mineral supplements as recommended by your health care provider. Do not drink alcohol if your health care provider tells you not to drink. If you drink alcohol: Limit how much you have to 0-2 drinks a day. Know how much alcohol is in your drink. In the [place] one drink equals one 12 oz bottle of beer (355 mL), one 5 oz glass of wine (148 mL), or one 1 oz glass of hard liquor (44 mL). Lifestyle Brush your teeth every morning and night with fluoride toothpaste. Floss one time each day. Exercise for at least 30 minutes 5 or more days each week. Do not use any  products that contain nicotine or tobacco. These products include cigarettes, chewing tobacco, and vaping devices, such as e-cigarettes. If you need help quitting, ask your health care provider. Do not use drugs. If you are sexually active,  practice safe sex. Use a condom or other form of protection to prevent STIs. Take aspirin only as told by your health care provider. Make sure that you understand how much to take and what form to take. Work with your health care provider to find out whether it is safe and beneficial for you to take aspirin daily. Find healthy ways to manage stress, such as: Meditation, yoga, or listening to music. Journaling. Talking to a trusted person. Spending time with friends and family. Minimize exposure to UV radiation to reduce your risk of skin cancer. Safety Always wear your seat belt while driving or riding in a vehicle. Do not drive: If you have been drinking alcohol. Do not ride with someone who has been drinking. When you are tired or distracted. While texting. If you have been using any mind-altering substances or drugs. Wear a helmet and other protective equipment during sports activities. If you have firearms in your house, make sure you follow all gun safety procedures. What's next? Go to your health care provider once a year for an annual wellness visit. Ask your health care provider how often you should have your eyes and teeth checked. Stay up to date on all vaccines. This information is not intended to replace advice given to you by your health care provider. Make sure you discuss any questions you have with your health care provider. Document Revised: 03/20/2021 Document Reviewed: 03/20/2021 Elsevier Patient Education  2024 [employer].

## 2023-03-24 NOTE — Progress Notes (Addendum)
Subjective:    Patient ID: Steven Raymond, male    DOB: 07-27-1966, 57 y.o.   MRN: [id number]  HPI  Pt in for wellness exam/cpe.   He is fasting. He have been exercising and plans to start eating better. More fruits and vegetables. Mild elevated sugar in the past. Purposeful weight loss. Feels better.  Slight frequent urination but only once at night.  He wants his psa done.  Pt mentioned last year.  "Pt mentions random occasional pain beneath left pectoralis region pain. But also some sporadic occasional other region of chest pain.    I explained to pt recommend ekg and he decllned"   But on review 01-15-2022 did do EKG. He rides a mountain bike. He goes max to 125. He states no pain on exercising. He states mild twing pain lateral pectoralis area. Will last only a second. No pleuritic description.  Happens for one second once a month.  No chest pain today. Since referring to cardiologist decided not to repeat EKG today.  He again bring up this vague description. Pt mom has atrial fibrillation. Dad 101 year old.  Phq-9 score 4. Gad-7 he states 0.  Review of Systems  Constitutional:  Negative for chills, fatigue and fever.  Respiratory:  Negative for cough, chest tightness, shortness of breath and wheezing.   Cardiovascular:  Negative for palpitations.       Rare twinge very aytpical sporadic and random one- three second chest pain 3 times a month at rest.. Pt thinks msk pain. No associated cardiac symptoms.  Gastrointestinal:  Negative for abdominal pain and blood in stool.  Genitourinary:  Negative for dysuria and flank pain.  Musculoskeletal:  Negative for back pain and gait problem.  Hematological:  Negative for adenopathy. Does not bruise/bleed easily.  Psychiatric/Behavioral:  Positive for dysphoric mood. Negative for confusion and suicidal ideas. The patient is not nervous/anxious.     Past Medical History:  Diagnosis Date   Hyperlipidemia    Kidney stones      Social  History   Socioeconomic History   Marital status: Single    Spouse name: Not on file   Number of children: Not on file   Years of education: Not on file   Highest education level: Not on file  Occupational History   Occupation: Armed forces operational officer.  Tobacco Use   Smoking status: Former   Smokeless tobacco: Never  Vaping Use   Vaping Use: Never used  Substance and Sexual Activity   Alcohol use: Yes    Alcohol/week: 0.0 standard drinks of alcohol   Drug use: Yes    Types: Marijuana   Sexual activity: Yes  Other Topics Concern   Not on file  Social History Narrative   Not on file   Social Determinants of Health   Financial Resource Strain: Not on file  Food Insecurity: Not on file  Transportation Needs: Not on file  Physical Activity: Not on file  Stress: Not on file  Social Connections: Not on file  Intimate Partner Violence: Not on file    Past Surgical History:  Procedure Laterality Date   REMOVE AND REPLACE LENS Right 01/19/2020   REMOVE AND REPLACE LENS Left 12/25/2019    No family history on file.  No Known Allergies  No current outpatient medications on file prior to visit.   No current facility-administered medications on file prior to visit.    BP 126/82   Pulse 70   Resp 18   Ht 6' 3.5" (  1.918 m)   Wt 206 lb (93.4 kg)   SpO2 99%   BMI 25.41 kg/m        Objective:   Physical Exam  General Mental Status- Alert. General Appearance- Not in acute distress.   Skin General: Color- Normal Color. Moisture- Normal Moisture.  Neck Carotid Arteries- Normal color. Moisture- Normal Moisture. No carotid bruits. No JVD.  Chest and Lung Exam Auscultation: Breath Sounds:-Normal.  Cardiovascular Auscultation:Rythm- Regular. Murmurs & Other Heart Sounds:Auscultation of the heart reveals- No Murmurs.  Abdomen Inspection:-Inspeection Normal. Palpation/Percussion:Note:No mass. Palpation and Percussion of the abdomen reveal- Non Tender, Non  Distended + BS, no rebound or guarding.  Neurologic Cranial Nerve exam:- CN III-XII intact(No nystagmus), symmetric smile. Finger to Nose:- Normal/Intact Strength:- 5/5 equal and symmetric strength both upper and lower extremities.   Anterior thorax- no chest wall pain on palpation.    Assessment & Plan:   Patient Instructions  For you wellness exam today I have ordered cbc, cmp and lipid panel.  Shingrix- if you can ask your pharmacy or date or to send [place] the dat  Recommend exercise and healthy diet.  We will let you know lab results as they come in.  Follow up date appointment will be determined after lab review.    For depression phq-9 score 5 gave counselor sheet to review. Recommend that you call behavioral health office to get scheduled. If you need referral for office or insurance let me know.   For frequenet urination get UA and psa.  For very atypical chest pain referred to cardiologist. In event of pain that is constant or persising palpation be seen in ED pending cardilologist appt.       [place], [place]   [id number] charge as did address atypical chest pain, frequent urination and depression

## 2023-03-25 ENCOUNTER — Other Ambulatory Visit (INDEPENDENT_AMBULATORY_CARE_PROVIDER_SITE_OTHER): Payer: BC Managed Care – PPO

## 2023-03-25 DIAGNOSIS — Z125 Encounter for screening for malignant neoplasm of prostate: Secondary | ICD-10-CM

## 2023-03-25 DIAGNOSIS — R350 Frequency of micturition: Secondary | ICD-10-CM | POA: Diagnosis not present

## 2023-03-25 DIAGNOSIS — R82998 Other abnormal findings in urine: Secondary | ICD-10-CM

## 2023-03-25 DIAGNOSIS — Z0000 Encounter for general adult medical examination without abnormal findings: Secondary | ICD-10-CM | POA: Diagnosis not present

## 2023-03-25 LAB — COMPREHENSIVE METABOLIC PANEL
ALT: 13 U/L (ref 0–53)
AST: 15 U/L (ref 0–37)
Albumin: 4.1 g/dL (ref 3.5–5.2)
Alkaline Phosphatase: 46 U/L (ref 39–117)
BUN: 21 mg/dL (ref 6–23)
CO2: 28 mEq/L (ref 19–32)
Calcium: 8.9 mg/dL (ref 8.4–10.5)
Chloride: 104 mEq/L (ref 96–112)
Creatinine, Ser: 1.17 mg/dL (ref 0.40–1.50)
GFR: 70.14 mL/min (ref >60.00)
Glucose, Bld: 108 mg/dL — ABNORMAL HIGH (ref 70–99)
Potassium: 4.5 mEq/L (ref 3.5–5.1)
Sodium: 139 mEq/L (ref 135–145)
Total Bilirubin: 0.5 mg/dL (ref 0.2–1.2)
Total Protein: 6.8 g/dL (ref 6.0–8.3)

## 2023-03-25 LAB — CBC WITH DIFFERENTIAL/PLATELET
Basophils Absolute: 0 10*3/uL (ref 0.0–0.1)
Basophils Relative: 1 % (ref 0.0–3.0)
Eosinophils Absolute: 0.1 10*3/uL (ref 0.0–0.7)
Eosinophils Relative: 3 % (ref 0.0–5.0)
HCT: 43.6 % (ref 39.0–52.0)
Hemoglobin: 14.6 g/dL (ref 13.0–17.0)
Lymphocytes Relative: 36.3 % (ref 12.0–46.0)
Lymphs Abs: 1.6 10*3/uL (ref 0.7–4.0)
MCHC: 33.5 g/dL (ref 30.0–36.0)
MCV: 94.2 fl (ref 78.0–100.0)
Monocytes Absolute: 0.4 10*3/uL (ref 0.1–1.0)
Monocytes Relative: 9.9 % (ref 3.0–12.0)
Neutro Abs: 2.2 10*3/uL (ref 1.4–7.7)
Neutrophils Relative %: 49.8 % (ref 43.0–77.0)
Platelets: 244 10*3/uL (ref 150.0–400.0)
RBC: 4.63 Mil/uL (ref 4.22–5.81)
RDW: 13.6 % (ref 11.5–15.5)
WBC: 4.4 10*3/uL (ref 4.0–10.5)

## 2023-03-25 LAB — LIPID PANEL
Cholesterol: 181 mg/dL (ref 0–200)
HDL: 45.2 mg/dL (ref >39.00)
LDL Cholesterol: 115 mg/dL — ABNORMAL HIGH (ref 0–99)
NonHDL: 135.44
Total CHOL/HDL Ratio: 4
Triglycerides: 104 mg/dL (ref 0.0–149.0)
VLDL: 20.8 mg/dL (ref 0.0–40.0)

## 2023-03-25 LAB — POC URINALSYSI DIPSTICK (AUTOMATED)
Bilirubin, UA: NEGATIVE
Blood, UA: NEGATIVE
Glucose, UA: NEGATIVE
Ketones, UA: NEGATIVE
Nitrite, UA: NEGATIVE
Protein, UA: NEGATIVE
Spec Grav, UA: 1.015 (ref 1.010–1.025)
Urobilinogen, UA: 0.2 E.U./dL
pH, UA: 6 (ref 5.0–8.0)

## 2023-03-25 LAB — PSA: PSA: 1.59 ng/mL (ref 0.10–4.00)

## 2023-03-25 NOTE — Addendum Note (Signed)
Addended by: Mervin Kung A on: 03/25/2023 10:53 AM   Modules accepted: Orders

## 2023-03-26 LAB — URINE CULTURE
MICRO NUMBER:: 15101947
Result:: NO GROWTH
SPECIMEN QUALITY:: ADEQUATE

## 2023-04-02 ENCOUNTER — Encounter: Payer: Self-pay | Admitting: General Practice

## 2023-05-14 ENCOUNTER — Encounter: Payer: Self-pay | Admitting: Medical

## 2023-05-14 DIAGNOSIS — F32A Depression, unspecified: Secondary | ICD-10-CM

## 2023-07-20 ENCOUNTER — Ambulatory Visit: Payer: BC Managed Care – PPO | Admitting: Psychology

## 2023-07-27 ENCOUNTER — Ambulatory Visit (INDEPENDENT_AMBULATORY_CARE_PROVIDER_SITE_OTHER): Payer: BC Managed Care – PPO | Admitting: Psychology

## 2023-07-27 DIAGNOSIS — F4323 Adjustment disorder with mixed anxiety and depressed mood: Secondary | ICD-10-CM | POA: Diagnosis not present
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
## 2023-08-24 ENCOUNTER — Ambulatory Visit (INDEPENDENT_AMBULATORY_CARE_PROVIDER_SITE_OTHER): Payer: BC Managed Care – PPO | Admitting: Psychology

## 2023-08-24 DIAGNOSIS — F4323 Adjustment disorder with mixed anxiety and depressed mood: Secondary | ICD-10-CM

## 2023-08-24 NOTE — Progress Notes (Signed)
[facility] Counselor/Therapist Progress Note  Patient ID: Steven Raymond, MRN: [id number]    Date: 08/24/23  Time Spent: 3:28  pm - 4:24 pm : 56 Minutes  Treatment Type: Individual Therapy.  Reported Symptoms: depression and anxiety.  Mental Status Exam: Appearance:  Casual     Behavior: Appropriate  Motor: Normal  Speech/Language:  Normal Rate  Affect: Appropriate  Mood: normal  Thought process: normal  Thought content:   WNL  Sensory/Perceptual disturbances:   WNL  Orientation: oriented to person, place, time/date, and situation  Attention: Good  Concentration: Good  Memory: WNL  Fund of knowledge:  Good  Insight:   Good  Judgment:  Good  Impulse Control: Good   Risk Assessment: Danger to Self:  No Self-injurious Behavior: No Danger to Others: No Duty to Warn:no Physical Aggression / Violence:No  Access to Firearms a concern: No  Gang Involvement:No   Subjective:   Steven Raymond participated in the session, in person in the office with the therapist, and consented to treatment Steven Raymond reviewed the events of the past week. We reviewed numerous treatment approaches including CBT, BA, Problem Solving, and Solution focused therapy. Psych-education regarding the Steven Raymond's diagnosis of Adjustment disorder with mixed anxiety and depressed mood was provided during the session. We discussed Steven Raymond goals treatment goals which include managing symptoms, maintain sense of self, boundaries for self and others, managing feelings of lack of control with his partner's illness and mood, managing frustrations. Steven Raymond provided verbal approval of the treatment plan.    Interventions: Psycho-education & Goal Setting.   Diagnosis:   Adjustment disorder with mixed anxiety and depressed mood  Psychiatric Treatment: No , NA  Treatment Plan:  Client Abilities/Strengths Steven Raymond is intelligent, self-aware, and motivated for change.   Support System: Outpatient  Therapy.   Client Treatment Preferences Outpatient Therapy.   Client Statement of Needs Steven Raymond would like to managing symptoms, maintain sense of self, boundaries for self and others, managing feelings of lack of control with his partner's illness and mood, managing frustrations.   Treatment Level Weekly  Symptoms  Anxiety: feeling anxious, difficulty stopping worry, worrying too much, trouble relaxing, irritability, and feeling afraid as something awful might happen.    (Status: maintained) Depression: Loss of interest, feeling down, trouble with sleep, lethargy, fluctuating appetite, feeling bad ab out self, and difficulty concentrating.   (Status: maintained)  Goals:   Steven Raymond experiences symptoms of depression and anxiety.   Treatment plan signed and available on s-drive:  No , pending signature.    Target Date: 08/23/24 Frequency: Weekly  Progress: 0 Modality: individual    Therapist will provide referrals for additional resources as appropriate.  Therapist will provide psycho-education regarding Steven Raymond's diagnosis and corresponding treatment approaches and interventions. Licensed Clinical Social Worker, [place], LCSW will support the patient's ability to achieve the goals identified. will employ CBT, BA, Problem-solving, Solution Focused, Mindfulness,  coping skills, & other evidenced-based practices will be used to promote progress towards healthy functioning to help manage decrease symptoms associated with his diagnosis.   Reduce overall level, frequency, and intensity of the feelings of depression, anxiety and panic evidenced by decreased overall symptoms from 6 to 7 days/week to 0 to 1 days/week per client report for at least 3 consecutive months. Verbally express understanding of the relationship between feelings of depression, anxiety and their impact on thinking patterns and behaviors. Verbalize an understanding of the role that distorted thinking plays in creating  fears, excessive worry, and ruminations.  (  Steven Raymond participated in the creation of the treatment plan)    Steven Ovens, LCSW

## 2023-09-28 ENCOUNTER — Ambulatory Visit: Payer: BC Managed Care – PPO | Admitting: Psychology

## 2024-03-25 ENCOUNTER — Ambulatory Visit: Admitting: Medical

## 2024-03-25 VITALS — BP 110/70 | HR 67 | Resp 18 | Ht 75.5 in | Wt 211.4 lb

## 2024-03-25 DIAGNOSIS — H6122 Impacted cerumen, left ear: Secondary | ICD-10-CM

## 2024-03-25 DIAGNOSIS — Z125 Encounter for screening for malignant neoplasm of prostate: Secondary | ICD-10-CM | POA: Diagnosis not present

## 2024-03-25 DIAGNOSIS — R0789 Other chest pain: Secondary | ICD-10-CM

## 2024-03-25 DIAGNOSIS — Z23 Encounter for immunization: Secondary | ICD-10-CM | POA: Diagnosis not present

## 2024-03-25 DIAGNOSIS — Z0000 Encounter for general adult medical examination without abnormal findings: Secondary | ICD-10-CM | POA: Diagnosis not present

## 2024-03-25 DIAGNOSIS — Z1211 Encounter for screening for malignant neoplasm of colon: Secondary | ICD-10-CM

## 2024-03-25 LAB — CBC WITH DIFFERENTIAL/PLATELET
Basophils Absolute: 0.1 10*3/uL (ref 0.0–0.1)
Basophils Relative: 1 % (ref 0.0–3.0)
Eosinophils Absolute: 0.1 10*3/uL (ref 0.0–0.7)
Eosinophils Relative: 2 % (ref 0.0–5.0)
HCT: 44.6 % (ref 39.0–52.0)
Hemoglobin: 15.1 g/dL (ref 13.0–17.0)
Lymphocytes Relative: 28.1 % (ref 12.0–46.0)
Lymphs Abs: 1.7 10*3/uL (ref 0.7–4.0)
MCHC: 33.8 g/dL (ref 30.0–36.0)
MCV: 93 fl (ref 78.0–100.0)
Monocytes Absolute: 0.5 10*3/uL (ref 0.1–1.0)
Monocytes Relative: 8.1 % (ref 3.0–12.0)
Neutro Abs: 3.6 10*3/uL (ref 1.4–7.7)
Neutrophils Relative %: 60.8 % (ref 43.0–77.0)
Platelets: 221 10*3/uL (ref 150.0–400.0)
RBC: 4.79 Mil/uL (ref 4.22–5.81)
RDW: 13.6 % (ref 11.5–15.5)
WBC: 5.9 10*3/uL (ref 4.0–10.5)

## 2024-03-25 LAB — COMPREHENSIVE METABOLIC PANEL WITH GFR
ALT: 13 U/L (ref 0–53)
AST: 16 U/L (ref 0–37)
Albumin: 4.3 g/dL (ref 3.5–5.2)
Alkaline Phosphatase: 48 U/L (ref 39–117)
BUN: 15 mg/dL (ref 6–23)
CO2: 28 meq/L (ref 19–32)
Calcium: 9.1 mg/dL (ref 8.4–10.5)
Chloride: 103 meq/L (ref 96–112)
Creatinine, Ser: 1.02 mg/dL (ref 0.40–1.50)
GFR: 82.11 mL/min (ref >60.00)
Glucose, Bld: 95 mg/dL (ref 70–99)
Potassium: 4.4 meq/L (ref 3.5–5.1)
Sodium: 138 meq/L (ref 135–145)
Total Bilirubin: 0.6 mg/dL (ref 0.2–1.2)
Total Protein: 7 g/dL (ref 6.0–8.3)

## 2024-03-25 LAB — PSA: PSA: 2.06 ng/mL (ref 0.10–4.00)

## 2024-03-25 LAB — LIPID PANEL
Cholesterol: 216 mg/dL — ABNORMAL HIGH (ref 0–200)
HDL: 64.9 mg/dL (ref >39.00)
LDL Cholesterol: 137 mg/dL — ABNORMAL HIGH (ref 0–99)
NonHDL: 150.83
Total CHOL/HDL Ratio: 3
Triglycerides: 69 mg/dL (ref 0.0–149.0)
VLDL: 13.8 mg/dL (ref 0.0–40.0)

## 2024-03-25 MED ORDER — AMOXICILLIN-POT CLAVULANATE 875-125 MG PO TABS: 1.0000 | ORAL_TABLET | Freq: Two times a day (BID) | ORAL | 0 refills | Status: DC

## 2024-03-25 NOTE — Addendum Note (Signed)
 Addended by: Ruthe Coward on: 03/25/2024 01:24 PM   Modules accepted: Orders

## 2024-03-25 NOTE — Addendum Note (Signed)
 Addended by: Serafina Damme on: 03/25/2024 12:05 PM   Modules accepted: Orders

## 2024-03-25 NOTE — Progress Notes (Signed)
 Subjective:    Patient ID: Steven Raymond, male    DOB: November 21, 1965, 58 y.o.   MRN: [id number]  HPI  Pt in for wellness exam.  He is fasting. He have been exercising and has been eating better. More fruits and vegetables. Mild elevated sugar in the past. Purposeful weight loss. Feels better.     Recently has some on and off sharp pain in his left ear on and off. Pt states had pain yesterday that was prominent. Overall pain for about 3 months.  Also update me 3-4 months ago had kidney stone. He states prior to that had kidney stone 12 years ago.   On last visit pt had For very atypical chest pain referred to cardiologist. In event of pain that is constant or persising palpation be seen in ED pending cardilologist appt.    Pt tells me I did refer him but he did not go to the referral. On review can see the referral.  Last referral notes stated Very random transient atypical chest pain over the past years. 1-3 sconds occurring 3 time a month on average. Pt does exercise. Prior EKG normal. One mild brady but he does exercise. Can pt be seen by 3 weeks.  Since last year about same frequency. No current pain. He is willing to go if referred again.  No heart attack or strokes.  The 10-year ASCVD risk score (Arnett DK, et al., 2019) is: 4.6%   Values used to calculate the score:     Age: 72 years     Clincally relevant sex: Male     Is Non-Hispanic African American: No     Diabetic: No     Tobacco smoker: No     Systolic Blood Pressure: 110 mmHg     Is BP treated: No     HDL Cholesterol: 45.2 mg/dL     Total Cholesterol: 181 mg/dL    He experiences intermittent ear pain, which was significantly painful yesterday but is not currently painful. The pain is described as coming and going, with a notable increase in intensity yesterday. No pain today    Past Medical History:  Diagnosis Date   Hyperlipidemia    Kidney stones      Social History   Socioeconomic History    Marital status: Single    Spouse name: Not on file   Number of children: Not on file   Years of education: Not on file   Highest education level: Some college, no degree  Occupational History   Occupation: Armed forces operational officer.  Tobacco Use   Smoking status: Former   Smokeless tobacco: Never  Vaping Use   Vaping status: Never Used  Substance and Sexual Activity   Alcohol use: Yes    Alcohol/week: 0.0 standard drinks of alcohol   Drug use: Yes    Types: Marijuana   Sexual activity: Yes  Other Topics Concern   Not on file  Social History Narrative   Not on file   Social Drivers of Health   Financial Resource Strain: Low Risk  (03/24/2024)   Overall Financial Resource Strain (CARDIA)    Difficulty of Paying Living Expenses: Not hard at all  Food Insecurity: No Food Insecurity (03/24/2024)   Hunger Vital Sign    Worried About Running Out of Food in the Last Year: Never true    Ran Out of Food in the Last Year: Never true  Transportation Needs: No Transportation Needs (03/24/2024)   PRAPARE - Transportation  Lack of Transportation (Medical): No    Lack of Transportation (Non-Medical): No  Physical Activity: Insufficiently Active (03/24/2024)   Exercise Vital Sign    Days of Exercise per Week: 2 days    Minutes of Exercise per Session: 30 min  Stress: No Stress Concern Present (03/24/2024)   [employer] of Occupational Health - Occupational Stress Questionnaire    Feeling of Stress: Only a little  Social Connections: Moderately Isolated (03/24/2024)   Social Connection and Isolation Panel    Frequency of Communication with Friends and Family: More than three times a week    Frequency of Social Gatherings with Friends and Family: More than three times a week    Attends Religious Services: Never    Database administrator or Organizations: No    Attends Engineer, structural: Not on file    Marital Status: Living with partner  Intimate Partner Violence: Not on  file    Past Surgical History:  Procedure Laterality Date   REMOVE AND REPLACE LENS Right 01/19/2020   REMOVE AND REPLACE LENS Left 12/25/2019    No family history on file.  No Known Allergies  No current outpatient medications on file prior to visit.   No current facility-administered medications on file prior to visit.    BP 110/70   Pulse 67   Resp 18   Ht 6' 3.5 (1.918 m)   Wt 211 lb 6.4 oz (95.9 kg)   SpO2 97%   BMI 26.07 kg/m      Review of Systems  Constitutional:  Negative for chills, fatigue and fever.  HENT:  Positive for ear pain. Negative for congestion, ear discharge, nosebleeds, postnasal drip, sinus pressure, sinus pain and sneezing.   Respiratory:  Negative for cough, chest tightness and wheezing.   Cardiovascular:  Negative for chest pain and palpitations.       Hx of atypical chest pain. None presently. Last brief event 2 weeks ago.  Gastrointestinal:  Negative for abdominal pain.  Genitourinary:  Negative for dysuria and frequency.  Musculoskeletal:  Negative for back pain.  Neurological:  Negative for dizziness, weakness, numbness and headaches.  Hematological:  Negative for adenopathy. Does not bruise/bleed easily.  Psychiatric/Behavioral:  Negative for behavioral problems and decreased concentration.     Past Medical History:  Diagnosis Date   Hyperlipidemia    Kidney stones      Social History   Socioeconomic History   Marital status: Single    Spouse name: Not on file   Number of children: Not on file   Years of education: Not on file   Highest education level: Some college, no degree  Occupational History   Occupation: Armed forces operational officer.  Tobacco Use   Smoking status: Former   Smokeless tobacco: Never  Vaping Use   Vaping status: Never Used  Substance and Sexual Activity   Alcohol use: Yes    Alcohol/week: 0.0 standard drinks of alcohol   Drug use: Yes    Types: Marijuana   Sexual activity: Yes  Other Topics Concern    Not on file  Social History Narrative   Not on file   Social Drivers of Health   Financial Resource Strain: Low Risk  (03/24/2024)   Overall Financial Resource Strain (CARDIA)    Difficulty of Paying Living Expenses: Not hard at all  Food Insecurity: No Food Insecurity (03/24/2024)   Hunger Vital Sign    Worried About Running Out of Food in the Last Year: Never true  Ran Out of Food in the Last Year: Never true  Transportation Needs: No Transportation Needs (03/24/2024)   PRAPARE - Administrator, Civil Service (Medical): No    Lack of Transportation (Non-Medical): No  Physical Activity: Insufficiently Active (03/24/2024)   Exercise Vital Sign    Days of Exercise per Week: 2 days    Minutes of Exercise per Session: 30 min  Stress: No Stress Concern Present (03/24/2024)   [employer] of Occupational Health - Occupational Stress Questionnaire    Feeling of Stress: Only a little  Social Connections: Moderately Isolated (03/24/2024)   Social Connection and Isolation Panel    Frequency of Communication with Friends and Family: More than three times a week    Frequency of Social Gatherings with Friends and Family: More than three times a week    Attends Religious Services: Never    Database administrator or Organizations: No    Attends Engineer, structural: Not on file    Marital Status: Living with partner  Intimate Partner Violence: Not on file    Past Surgical History:  Procedure Laterality Date   REMOVE AND REPLACE LENS Right 01/19/2020   REMOVE AND REPLACE LENS Left 12/25/2019    No family history on file.  No Known Allergies  No current outpatient medications on file prior to visit.   No current facility-administered medications on file prior to visit.    BP 110/70   Pulse 67   Resp 18   Ht 6' 3.5 (1.918 m)   Wt 211 lb 6.4 oz (95.9 kg)   SpO2 97%   BMI 26.07 kg/m        Objective:   Physical Exam   General Mental Status-  Alert. General Appearance- Not in acute distress.   Skin General: Color- Normal Color. Moisture- Normal Moisture.  Neck Carotid Arteries- Normal color. Moisture- Normal Moisture. No carotid bruits. No JVD.  Chest and Lung Exam Auscultation: Breath Sounds:-CTA  Cardiovascular Auscultation:Rythm- RRR Murmurs & Other Heart Sounds:Auscultation of the heart reveals- No Murmurs.  Abdomen Inspection:-Inspeection Normal. Palpation/Percussion:Note:No mass. Palpation and Percussion of the abdomen reveal- Non Tender, Non Distended + BS, no rebound or guarding.   Neurologic Cranial Nerve exam:- CN III-XII intact(No nystagmus), symmetric smile. Strength:- 5/5 equal and symmetric strength both upper and lower extremities.     Heent- no sinus pressure. Rt ear canal clear and normal tm. Left ear severe wax obstruction. Can see tm at all. No tragus tenderness. No posterior auricular tenderness.      Assessment & Plan:   Patient Instructions  For you wellness exam today I have ordered cbc, cmp, psa and  lipid panel, ua and hiv.  Vaccine offered today shingrix. Will get today.   Recommend exercise and healthy diet.  We will let you know lab results as they come in.  Up to date on colonscopy  Follow up date appointment will be determined after lab review.   Placed referral to cardiologist again as we discussed. Today EKG nsr. If any severe constant pain pending cardiologist evaluation be seen in the ED.  For cerumen impaction use debrox otc as discussed Schedule appt 03-29-24 1 pm for ear lavage. During interim if severe constant pain then can use augmentin antibiotic I made available.      Sylvia Everts, PA-C   [id number] charge as did address recurrent atypical pain and cerumen impaction

## 2024-03-25 NOTE — Patient Instructions (Addendum)
 For you wellness exam today I have ordered cbc, cmp, psa and  lipid panel, ua and hiv.  Vaccine offered today shingrix. Will get today.   Recommend exercise and healthy diet.  We will let you know lab results as they come in.  Up to date on colonscopy  Follow up date appointment will be determined after lab review.   Placed referral to cardiologist again as we discussed. Today EKG nsr. If any severe constant pain pending cardiologist evaluation be seen in the ED.  For cerumen impaction use debrox otc as discussed Schedule appt 03-29-24 1 pm for ear lavage. During interim if severe constant pain then can use augmentin antibiotic I made available.  Preventive Care 10-18 Years Old, Male Preventive care refers to lifestyle choices and visits with your health care provider that can promote health and wellness. Preventive care visits are also called wellness exams. What can I expect for my preventive care visit? Counseling During your preventive care visit, your health care provider may ask about your: Medical history, including: Past medical problems. Family medical history. Current health, including: Emotional well-being. Home life and relationship well-being. Sexual activity. Lifestyle, including: Alcohol, nicotine or tobacco, and drug use. Access to firearms. Diet, exercise, and sleep habits. Safety issues such as seatbelt and bike helmet use. Sunscreen use. Work and work Astronomer. Physical exam Your health care provider will check your: Height and weight. These may be used to calculate your BMI (body mass index). BMI is a measurement that tells if you are at a healthy weight. Waist circumference. This measures the distance around your waistline. This measurement also tells if you are at a healthy weight and may help predict your risk of certain diseases, such as type 2 diabetes and high blood pressure. Heart rate and blood pressure. Body temperature. Skin for abnormal  spots. What immunizations do I need?  Vaccines are usually given at various ages, according to a schedule. Your health care provider will recommend vaccines for you based on your age, medical history, and lifestyle or other factors, such as travel or where you work. What tests do I need? Screening Your health care provider may recommend screening tests for certain conditions. This may include: Lipid and cholesterol levels. Diabetes screening. This is done by checking your blood sugar (glucose) after you have not eaten for a while (fasting). Hepatitis B test. Hepatitis C test. HIV (human immunodeficiency virus) test. STI (sexually transmitted infection) testing, if you are at risk. Lung cancer screening. Prostate cancer screening. Colorectal cancer screening. Talk with your health care provider about your test results, treatment options, and if necessary, the need for more tests. Follow these instructions at home: Eating and drinking  Eat a diet that includes fresh fruits and vegetables, whole grains, lean protein, and low-fat dairy products. Take vitamin and mineral supplements as recommended by your health care provider. Do not drink alcohol if your health care provider tells you not to drink. If you drink alcohol: Limit how much you have to 0-2 drinks a day. Know how much alcohol is in your drink. In the [place] one drink equals one 12 oz bottle of beer (355 mL), one 5 oz glass of wine (148 mL), or one 1 oz glass of hard liquor (44 mL). Lifestyle Brush your teeth every morning and night with fluoride toothpaste. Floss one time each day. Exercise for at least 30 minutes 5 or more days each week. Do not use any products that contain nicotine or tobacco. These products include  cigarettes, chewing tobacco, and vaping devices, such as e-cigarettes. If you need help quitting, ask your health care provider. Do not use drugs. If you are sexually active, practice safe sex. Use a condom or  other form of protection to prevent STIs. Take aspirin only as told by your health care provider. Make sure that you understand how much to take and what form to take. Work with your health care provider to find out whether it is safe and beneficial for you to take aspirin daily. Find healthy ways to manage stress, such as: Meditation, yoga, or listening to music. Journaling. Talking to a trusted person. Spending time with friends and family. Minimize exposure to UV radiation to reduce your risk of skin cancer. Safety Always wear your seat belt while driving or riding in a vehicle. Do not drive: If you have been drinking alcohol. Do not ride with someone who has been drinking. When you are tired or distracted. While texting. If you have been using any mind-altering substances or drugs. Wear a helmet and other protective equipment during sports activities. If you have firearms in your house, make sure you follow all gun safety procedures. What's next? Go to your health care provider once a year for an annual wellness visit. Ask your health care provider how often you should have your eyes and teeth checked. Stay up to date on all vaccines. This information is not intended to replace advice given to you by your health care provider. Make sure you discuss any questions you have with your health care provider. Document Revised: 03/20/2021 Document Reviewed: 03/20/2021 Elsevier Patient Education  2024 [employer].

## 2024-03-26 ENCOUNTER — Ambulatory Visit: Payer: Self-pay | Admitting: Medical

## 2024-03-29 ENCOUNTER — Ambulatory Visit: Admitting: Medical

## 2024-03-29 ENCOUNTER — Other Ambulatory Visit (HOSPITAL_BASED_OUTPATIENT_CLINIC_OR_DEPARTMENT_OTHER): Payer: Self-pay

## 2024-03-29 VITALS — BP 116/64 | HR 92 | Resp 18 | Ht 75.5 in | Wt 211.0 lb

## 2024-03-29 DIAGNOSIS — H6122 Impacted cerumen, left ear: Secondary | ICD-10-CM

## 2024-03-29 DIAGNOSIS — H6690 Otitis media, unspecified, unspecified ear: Secondary | ICD-10-CM

## 2024-03-29 DIAGNOSIS — H60312 Diffuse otitis externa, left ear: Secondary | ICD-10-CM | POA: Diagnosis not present

## 2024-03-29 MED ORDER — NEOMYCIN-POLYMYXIN-HC 3.5-10000-1 OT SOLN
3.0000 [drp] | Freq: Three times a day (TID) | OTIC | 0 refills | Status: AC
  Filled 2024-03-29: qty 10, 22d supply, fill #0

## 2024-03-29 MED ORDER — AMOXICILLIN-POT CLAVULANATE 875-125 MG PO TABS
1.0000 | ORAL_TABLET | Freq: Two times a day (BID) | ORAL | 0 refills | Status: AC
  Filled 2024-03-29 (×2): qty 20, 10d supply, fill #0

## 2024-03-29 NOTE — Progress Notes (Signed)
 Subjective:    Patient ID: Steven Raymond, male    DOB: 07-05-1966, 58 y.o.   MRN: [id number]  HPI Steven Raymond is a 58 year old male who presents with recent left ear pain.  He has been experiencing left ear pain for the past two to three days, which began while using Debrox drops to soften earwax in preparation for an upcoming ear lavage that was to be done today. The earwax was noted to be dry during the last check, prompting the use of Debrox.  The pain occurs when he turns his neck and when i examined ear today. He has been using Debrox drops, but no other medications were mentioned in relation to his ear pain.   Review of Systems  Constitutional:  Negative for fatigue and fever.  HENT:  Positive for ear pain. Negative for congestion, postnasal drip, sinus pressure and sinus pain.   Respiratory:  Negative for cough and wheezing.   Cardiovascular:  Negative for chest pain and palpitations.  Gastrointestinal:  Negative for abdominal pain.  Musculoskeletal:  Negative for back pain.  Skin:  Negative for rash.  Neurological:  Negative for dizziness and numbness.  Hematological:  Negative for adenopathy. Does not bruise/bleed easily.    Past Medical History:  Diagnosis Date   Hyperlipidemia    Kidney stones      Social History   Socioeconomic History   Marital status: Single    Spouse name: Not on file   Number of children: Not on file   Years of education: Not on file   Highest education level: Some college, no degree  Occupational History   Occupation: Armed forces operational officer.  Tobacco Use   Smoking status: Former   Smokeless tobacco: Never  Vaping Use   Vaping status: Never Used  Substance and Sexual Activity   Alcohol use: Yes    Alcohol/week: 0.0 standard drinks of alcohol   Drug use: Yes    Types: Marijuana   Sexual activity: Yes  Other Topics Concern   Not on file  Social History Narrative   Not on file   Social Drivers of Health   Financial  Resource Strain: Low Risk  (03/24/2024)   Overall Financial Resource Strain (CARDIA)    Difficulty of Paying Living Expenses: Not hard at all  Food Insecurity: No Food Insecurity (03/24/2024)   Hunger Vital Sign    Worried About Running Out of Food in the Last Year: Never true    Ran Out of Food in the Last Year: Never true  Transportation Needs: No Transportation Needs (03/24/2024)   PRAPARE - Administrator, Civil Service (Medical): No    Lack of Transportation (Non-Medical): No  Physical Activity: Insufficiently Active (03/24/2024)   Exercise Vital Sign    Days of Exercise per Week: 2 days    Minutes of Exercise per Session: 30 min  Stress: No Stress Concern Present (03/24/2024)   [employer] of Occupational Health - Occupational Stress Questionnaire    Feeling of Stress: Only a little  Social Connections: Moderately Isolated (03/24/2024)   Social Connection and Isolation Panel    Frequency of Communication with Friends and Family: More than three times a week    Frequency of Social Gatherings with Friends and Family: More than three times a week    Attends Religious Services: Never    Database administrator or Organizations: No    Attends Banker Meetings: Not on file    Marital Status:  Living with partner  Intimate Partner Violence: Not on file    Past Surgical History:  Procedure Laterality Date   REMOVE AND REPLACE LENS Right 01/19/2020   REMOVE AND REPLACE LENS Left 12/25/2019    No family history on file.  No Known Allergies  Current Outpatient Medications on File Prior to Visit  Medication Sig Dispense Refill   amoxicillin -clavulanate (AUGMENTIN ) 875-125 MG tablet Take 1 tablet by mouth 2 (two) times daily. 20 tablet 0   No current facility-administered medications on file prior to visit.    BP 116/64   Pulse 92   Resp 18   Ht 6' 3.5 (1.918 m)   Wt 211 lb (95.7 kg)   SpO2 95%   BMI 26.03 kg/m        Objective:   Physical  Exam   General- No acute distress. Pleasant patient. Neck- Full range of motion, no jvd Lungs- Clear, even and unlabored. Heart- regular rate and rhythm. Neurologic- CNII- XII grossly intact.  Heent- left ear wax blocking view of tm except small portion of tm that looks slight red. No posterior auricle pain. Pain on view of tm/ear.         Assessment & Plan:   Patient Instructions  Left Ear Pain with Possible Otitis Externa and OM. Can't fully evaluate the ear due to moderate to large amount of wax Left ear pain likely due to otitis externa and concern for OM. Erythematous tympanic membrane observed. - Prescribed Augmentin  for 10 days. - Prescribed Cortisporin Otic Drops. - Advised discontinuation of Debrox. - Referred to ENT for cerumen lavage and removal in 14 days. - Instructed to report if ear pain worsens.   Aubrianna Orchard, PA-C

## 2024-03-29 NOTE — Patient Instructions (Addendum)
 Left Ear Pain with Possible Otitis Externa and OM. Can't fully evaluate the ear due to moderate to large amount of wax Left ear pain likely due to otitis externa and concern for OM.  Small erythematous tympanic membrane observed. - Prescribed Augmentin  for 10 days. - Prescribed Cortisporin Otic Drops. - Advised discontinuation of Debrox. - Referred to ENT for cerumen lavage and removal in 14 days. - Instructed to report if ear pain worsens.

## 2024-03-30 ENCOUNTER — Ambulatory Visit

## 2024-03-30 VITALS — BP 134/76 | HR 72 | Ht 76.0 in | Wt 203.0 lb

## 2024-03-30 DIAGNOSIS — R072 Precordial pain: Secondary | ICD-10-CM | POA: Insufficient documentation

## 2024-03-30 DIAGNOSIS — Z789 Other specified health status: Secondary | ICD-10-CM | POA: Insufficient documentation

## 2024-03-30 DIAGNOSIS — E782 Mixed hyperlipidemia: Secondary | ICD-10-CM

## 2024-03-30 NOTE — Assessment & Plan Note (Signed)
 Precordial pain, atypical in nature.  At times associated with exertion and at times with rest.  Has risk factors in the form of age, smoking history, dyslipidemia.  Will proceed with a treadmill EKG stress test to assess his functional capacity and for any significant symptoms suggestive of ischemia.

## 2024-03-30 NOTE — Assessment & Plan Note (Signed)
 Dyslipidemia based on recent lipid panel from 6/20-20 25 total cholesterol 216, HDL 64, LDL 137 and triglycerides 69.  The 10-year ASCVD risk score (Arnett DK, et al., 2019) is: 5.8%   Values used to calculate the score:     Age: 58 years     Clincally relevant sex: Male     Is Non-Hispanic African American: No     Diabetic: No     Tobacco smoker: No     Systolic Blood Pressure: 134 mmHg     Is BP treated: No     HDL Cholesterol: 64.9 mg/dL     Total Cholesterol: 216 mg/dL  In this context discussed statin therapy. He is modifying his diet and wants to see how it pans out with his cholesterol control.  Reasonable to wait. In 6 months if he continues to have elevated cholesterol levels would recommend statin therapy. If he is still on the edge at that time we discussed options for considering calcium score study to help with the decision regarding whether to go statin therapy.  He was appreciative of this information. Will follow-up

## 2024-03-30 NOTE — Progress Notes (Signed)
 Cardiology Consultation:    Date:  03/30/2024   ID:  Steven Raymond, DOB May 09, 1966, MRN [id number]  PCP:  Dorina Loving, PA-C  Cardiologist:  Alean SAUNDERS Jasn Xia, MD   Referring MD: Dorina Loving RIGGERS   No chief complaint on file.    ASSESSMENT AND PLAN:   Mr. Steven Raymond 58 year old male history of renal stones, prediabetes and hyperlipidemia.  Former nicotine use [cocaine and chewing tobacco; quit 5 months ago], moderate alcohol consumption. No prior history of CAD, MI, CHF. Here for further evaluation of atypical chest pain symptoms Problem List Items Addressed This Visit     Hyperlipidemia   Dyslipidemia based on recent lipid panel from 6/20-20 25 total cholesterol 216, HDL 64, LDL 137 and triglycerides 69.  The 10-year ASCVD risk score (Arnett DK, et al., 2019) is: 5.8%   Values used to calculate the score:     Age: 17 years     Clincally relevant sex: Male     Is Non-Hispanic African American: No     Diabetic: No     Tobacco smoker: No     Systolic Blood Pressure: 134 mmHg     Is BP treated: No     HDL Cholesterol: 64.9 mg/dL     Total Cholesterol: 216 mg/dL  In this context discussed statin therapy. He is modifying his diet and wants to see how it pans out with his cholesterol control.  Reasonable to wait. In 6 months if he continues to have elevated cholesterol levels would recommend statin therapy. If he is still on the edge at that time we discussed options for considering calcium score study to help with the decision regarding whether to go statin therapy.  He was appreciative of this information. Will follow-up       Precordial pain - Primary   Precordial pain, atypical in nature.  At times associated with exertion and at times with rest.  Has risk factors in the form of age, smoking history, dyslipidemia.  Will proceed with a treadmill EKG stress test to assess his functional capacity and for any significant symptoms suggestive of ischemia.        Relevant Orders   Exercise Tolerance Test   Cardiac Stress Test: Informed Consent Details: Physician/Practitioner Attestation; Transcribe to consent form and obtain patient signature   Moderate alcohol consumption   Advised abstinence or further cut down on alcohol consumption to no more than 2-3 times a week and no more than 1-2 drinks on the days he does consume.       Return to clinic tentatively based on test results, otherwise we will follow-up in 6 months regarding dyslipidemia.   History of Present Illness:    Steven Raymond is a 58 y.o. male who is being seen today for the evaluation of chest pain at the request of [place], Loving, [place].  Pleasant man here for the visit by himself.  Was at home with his girlfriend.  Works in Restaurant manager, fast food position at [employer] and describes work as low stress and not physically demanding.  Mentions he exercises on and off but more recently has not been very regular.  Has history of renal stones, prediabetes and hyperlipidemia.  Former nicotine use [cocaine and chewing tobacco; quit 5 months ago], moderate alcohol consumption. No prior history of CAD, MI, CHF. Denies any significant family history of heart disease other than mother with atrial fibrillation and an uncle who had MI in his 42s.  Father currently alive in his 75s without any  significant heart disease.  Here for the visit today mentions he has had atypical sensation of chest discomfort which lasts briefly and over the left side on the precordium.  Noticeable at times with activity and also occasionally at rest.  Couple weeks ago had an episode while driving in the car where he felt the symptoms along with a sensation of lightheadedness and tiredness that lasted for few minutes.  With regards to dyslipidemia he has reviewed his recent cholesterol levels with his PCP.  Since then has modified his diet for the past week and is motivated to work on it more aggressively.  Has quit  smoking tobacco.  Was using nicotine chewing until about 4 to 5 months ago.  Rarely he might have a puff or 2 of tobacco from his partner. Drinks alcohol on almost a daily basis more than couple drinks.  EKG from 6/20-20 25 at PCPs office note sinus rhythm heart rate 61/min, PR interval 132 ms, QRS duration 88 ms, normal axis, no ST-T changes to suggest ischemia.  Last blood work from 03/25/2024 with lipid panel showing total cholesterol 216, HDL 64, LDL 137, triglycerides 69. CMP unremarkable with BUN 15, creatinine 1.02 and EGFR 82. Normal transaminases and alkaline phosphatase. CBC unremarkable with hemoglobin 15.1 and hematocrit 44.6.  Last hemoglobin A1c available to review is from April 2023 measuring 6.2  Past Medical History:  Diagnosis Date   Hyperlipidemia    Kidney stones     Past Surgical History:  Procedure Laterality Date   REMOVE AND REPLACE LENS Right 01/19/2020   REMOVE AND REPLACE LENS Left 12/25/2019    Current Medications: Current Meds  Medication Sig   amoxicillin -clavulanate (AUGMENTIN ) 875-125 MG tablet Take 1 tablet by mouth 2 (two) times daily.   neomycin-polymyxin-hydrocortisone (CORTISPORIN) OTIC solution Place 3 drops into the left ear 3 (three) times daily.     Allergies:   Patient has no known allergies.   Social History   Socioeconomic History   Marital status: Single    Spouse name: Not on file   Number of children: Not on file   Years of education: Not on file   Highest education level: Some college, no degree  Occupational History   Occupation: Armed forces operational officer.  Tobacco Use   Smoking status: Former   Smokeless tobacco: Never  Vaping Use   Vaping status: Never Used  Substance and Sexual Activity   Alcohol use: Yes    Alcohol/week: 0.0 standard drinks of alcohol   Drug use: Yes    Types: Marijuana   Sexual activity: Yes  Other Topics Concern   Not on file  Social History Narrative   Not on file   Social Drivers of Health    Financial Resource Strain: Low Risk  (03/24/2024)   Overall Financial Resource Strain (CARDIA)    Difficulty of Paying Living Expenses: Not hard at all  Food Insecurity: No Food Insecurity (03/24/2024)   Hunger Vital Sign    Worried About Running Out of Food in the Last Year: Never true    Ran Out of Food in the Last Year: Never true  Transportation Needs: No Transportation Needs (03/24/2024)   PRAPARE - Administrator, Civil Service (Medical): No    Lack of Transportation (Non-Medical): No  Physical Activity: Insufficiently Active (03/24/2024)   Exercise Vital Sign    Days of Exercise per Week: 2 days    Minutes of Exercise per Session: 30 min  Stress: No Stress Concern Present (03/24/2024)  [employer] of Occupational Health - Occupational Stress Questionnaire    Feeling of Stress: Only a little  Social Connections: Moderately Isolated (03/24/2024)   Social Connection and Isolation Panel    Frequency of Communication with Friends and Family: More than three times a week    Frequency of Social Gatherings with Friends and Family: More than three times a week    Attends Religious Services: Never    Database administrator or Organizations: No    Attends Engineer, structural: Not on file    Marital Status: Living with partner     Family History: The patient's family history is not on file. ROS:   Please see the history of present illness.    All 14 point review of systems negative except as described per history of present illness.  EKGs/Labs/Other Studies Reviewed:    The following studies were reviewed today:   EKG:       Recent Labs: 03/25/2024: ALT 13; BUN 15; Creatinine, Ser 1.02; Hemoglobin 15.1; Platelets 221.0; Potassium 4.4; Sodium 138  Recent Lipid Panel    Component Value Date/Time   CHOL 216 (H) 03/25/2024 1207   TRIG 69.0 03/25/2024 1207   HDL 64.90 03/25/2024 1207   CHOLHDL 3 03/25/2024 1207   VLDL 13.8 03/25/2024 1207   LDLCALC  137 (H) 03/25/2024 1207    Physical Exam:    VS:  BP 134/76   Pulse 72   Ht 6' 4 (1.93 m)   Wt 203 lb (92.1 kg)   SpO2 98%   BMI 24.71 kg/m     Wt Readings from Last 3 Encounters:  03/30/24 203 lb (92.1 kg)  03/29/24 211 lb (95.7 kg)  03/25/24 211 lb 6.4 oz (95.9 kg)     GENERAL:  Well nourished, well developed in no acute distress NECK: No JVD; No carotid bruits CARDIAC: RRR, S1 and S2 present, no murmurs, no rubs, no gallops CHEST:  Clear to auscultation without rales, wheezing or rhonchi  Extremities: No pitting pedal edema. Pulses bilaterally symmetric with radial 2+ and dorsalis pedis 2+ NEUROLOGIC:  Alert and oriented x 3  Medication Adjustments/Labs and Tests Ordered: Current medicines are reviewed at length with the patient today.  Concerns regarding medicines are outlined above.  Orders Placed This Encounter  Procedures   Cardiac Stress Test: Informed Consent Details: Physician/Practitioner Attestation; Transcribe to consent form and obtain patient signature   Exercise Tolerance Test   No orders of the defined types were placed in this encounter.   Signed, Alean jess Kobus, MD, MPH, [facility] 03/30/2024 11:16 AM    [facility]

## 2024-03-30 NOTE — Assessment & Plan Note (Signed)
 Advised abstinence or further cut down on alcohol consumption to no more than 2-3 times a week and no more than 1-2 drinks on the days he does consume.

## 2024-03-30 NOTE — Patient Instructions (Signed)
 Medication Instructions:  No changes *If you need a refill on your cardiac medications before your next appointment, please call your pharmacy*  Lab Work: No labs If you have labs (blood work) drawn today and your tests are completely normal, you will receive your results only by: MyChart Message (if you have MyChart) OR A paper copy in the mail If you have any lab test that is abnormal or we need to change your treatment, we will call you to review the results.  Testing/Procedures: Treadmill stress test Your physician has requested that you have an Exercise Stress Test. An exercise tolerance test is a test to check how your heart works during exercise. You will need to walk on a treadmill for this test. An electrocardiogram (ECG) will record your heartbeat when you are at rest and when you are exercising.    Please arrive 15 minutes prior to your appointment time for registration and insurance purposes.   The test will take approximately 45 minutes to complete.   How to prepare for your Exercise Stress Test: Do bring a list of your current medications with you.  If not listed below, you may take your medications as normal. Do wear comfortable clothes (no dresses or overalls) and walking shoes, tennis shoes preferred (no heels or open toed shoes are allowed) Do Not wear cologne, perfume, aftershave or lotions (deodorant is allowed). Do not drink or eat foods with caffeine for 24 hours before the test. (Chocolate, coffee, tea, or energy drinks) If you use an inhaler, bring it with you to the test. Do not smoke for 4 hours before the test.    If these instructions are not followed, your test will have to be rescheduled.   If you cannot keep your appointment, please provide 24 hours notification to our office, to avoid a possible $50 charge to your account.  Follow-Up: At [facility], you and your health needs are our priority.  As part of our continuing mission to provide you  with exceptional heart care, our providers are all part of one team.  This team includes your primary Cardiologist (physician) and Advanced Practice Providers or APPs (Physician Assistants and Nurse Practitioners) who all work together to provide you with the care you need, when you need it.  Your next appointment:   6 month(s)  Provider:   Alean Kobus, MD    We recommend signing up for the patient portal called MyChart.  Sign up information is provided on this After Visit Summary.  MyChart is used to connect with patients for Virtual Visits (Telemedicine).  Patients are able to view lab/test results, encounter notes, upcoming appointments, etc.  Non-urgent messages can be sent to your provider as well.   To learn more about what you can do with MyChart, go to [email or web address].

## 2024-04-05 ENCOUNTER — Encounter (INDEPENDENT_AMBULATORY_CARE_PROVIDER_SITE_OTHER): Payer: Self-pay | Admitting: Physician Assistant

## 2024-04-05 ENCOUNTER — Ambulatory Visit (INDEPENDENT_AMBULATORY_CARE_PROVIDER_SITE_OTHER): Admitting: Physician Assistant

## 2024-04-05 VITALS — BP 103/71 | HR 73

## 2024-04-05 DIAGNOSIS — H6122 Impacted cerumen, left ear: Secondary | ICD-10-CM

## 2024-04-05 NOTE — Progress Notes (Signed)
 Dear Dr. Dorina, Here is my assessment for our mutual patient, Steven Raymond. Thank you for allowing me the opportunity to care for your patient. Please do not hesitate to contact me should you have any other questions. Sincerely, Steven Cohen PA-C  Otolaryngology Clinic Note Referring provider: Dr. Dorina HPI:  Steven Raymond is a 58 y.o. male kindly referred by Dr. Dorina   The patient is a 58 year old gentleman seen in our office for evaluation of left-sided ear pain.  He patient notes approximate 3 months ago he felt a sharp pain in his left ear.  He notes this was located between the angle of the jaw and the ear along the SCM.  He notes the symptoms would go away and come back, worse with right lateral rotation of his neck.  He reports he saw his primary care provider and noted a cerumen impaction, he was started on Debrox.  He went back to the office and was told that his eardrum was red and was started on Augmentin , he notes he has 2 days left of Augmentin  therapy.  He notes no further pain after taking the antibiotics.  He feels that his hearing is normal, no drainage, no previous history of the same.  No previous history of head or neck surgery.  No ringing or dizziness.   Independent Review of Additional Tests or Records:  None   PMH/Meds/All/SocHx/FamHx/ROS:   Past Medical History:  Diagnosis Date   Hyperlipidemia    Kidney stones      Past Surgical History:  Procedure Laterality Date   REMOVE AND REPLACE LENS Right 01/19/2020   REMOVE AND REPLACE LENS Left 12/25/2019    History reviewed. No pertinent family history.   Social Connections: Moderately Isolated (03/24/2024)   Social Connection and Isolation Panel    Frequency of Communication with Friends and Family: More than three times a week    Frequency of Social Gatherings with Friends and Family: More than three times a week    Attends Religious Services: Never    Database administrator or Organizations: No     Attends Engineer, structural: Not on file    Marital Status: Living with partner      Current Outpatient Medications:    amoxicillin -clavulanate (AUGMENTIN ) 875-125 MG tablet, Take 1 tablet by mouth 2 (two) times daily., Disp: 20 tablet, Rfl: 0   neomycin -polymyxin-hydrocortisone (CORTISPORIN ) OTIC solution, Place 3 drops into the left ear 3 (three) times daily., Disp: 10 mL, Rfl: 0   Physical Exam:   BP 103/71   Pulse 73   SpO2 96%   Pertinent Findings  CN II-XII intact Left external auditory canal with cerumen impaction, right EAC with some cerumen not impacted; after removal some prominent blood vessels along the left tympanic membrane but no erythema, no middle ear effusion Weber 512: equal Rinne 512: AC > BC b/l  Anterior rhinoscopy: Septum midline; bilateral inferior turbinates with minimal hypertrophy No lesions of oral cavity/oropharynx; dentition within normal limits No obviously palpable neck masses/lymphadenopathy/thyromegaly No respiratory distress or stridor    Seprately Identifiable Procedures:  Procedure: bilateral ear microscopy and cerumen removal using microscope (CPT 30789) - Mod 25 Pre-procedure diagnosis: unilateral cerumen impaction left external auditory canal Post-procedure diagnosis: same Indication: bilateral cerumen impaction; given patient's otologic complaints and history as well as for improved and comprehensive examination of external ear and tympanic membrane, bilateral otologic examination using microscope was performed and impacted cerumen removed  Procedure: Patient was placed semi-recumbent. Both ear canals  were examined using the microscope with findings above. Cerumen removed from the left external auditory canal using suction and currette with improvement in EAC examination and patency. Left: EAC was patent. TM was intact . Middle ear was aerated. Drainage: none Right: EAC was patent. TM was intact . Middle ear was aerated . Drainage:  none Patient tolerated the procedure well.   Impression & Plans:  Steven Raymond is a 58 y.o. male with the following   Cerumen impaction-  Cerumen impaction removed without difficulty, he did have some prominent blood vessels along the left TM, no signs of infection.  Uncertain if he had acute otitis media or irritation of the TM.  He is feeling better today with no acute concerns.  Hearing is at baseline.  The patient may follow-up on a as needed basis.  He was given strict return precautions he verbalized understanding and agreement to today's plan.   - f/u PRN   Thank you for allowing me the opportunity to care for your patient. Please do not hesitate to contact me should you have any other questions.  Sincerely, Steven Cohen PA-C [facility] Phone: [phone] Fax: [phone]  04/05/2024, 10:42 AM

## 2024-04-19 ENCOUNTER — Telehealth (HOSPITAL_COMMUNITY): Payer: Self-pay

## 2024-04-19 NOTE — Telephone Encounter (Signed)
 Spoke with the patient, instructions given. S.Draylon Mercadel CCT

## 2024-04-29 ENCOUNTER — Ambulatory Visit (HOSPITAL_COMMUNITY)
Admission: RE | Admit: 2024-04-29 | Discharge: 2024-04-29 | Disposition: A | Discharge status: Home / Self Care | Source: Ambulatory Visit

## 2024-04-29 DIAGNOSIS — R072 Precordial pain: Secondary | ICD-10-CM | POA: Diagnosis not present

## 2024-04-29 LAB — EXERCISE TOLERANCE TEST
Angina Index: 0
Base ST Depression (mm): 0 mm
Duke Treadmill Score: 10
Estimated workload: 11.8
Exercise duration (min): 10 min
Exercise duration (sec): 3 s
MPHR: 164 {beats}/min
Peak HR: 157 {beats}/min
Percent HR: 95 %
RPE: 18
Rest HR: 67 {beats}/min
ST Depression (mm): 0 mm

## 2024-05-04 ENCOUNTER — Ambulatory Visit: Payer: Self-pay
